# Patient Record
Sex: Male | Born: 1935 | Race: White | Hispanic: No | Marital: Married | Smoking: Never smoker
Health system: Southern US, Community
[De-identification: ages and names within clinical notes are randomized; demographics above are authoritative.]

## PROBLEM LIST (undated history)

## (undated) DIAGNOSIS — E785 Hyperlipidemia, unspecified: Secondary | ICD-10-CM

## (undated) DIAGNOSIS — I739 Peripheral vascular disease, unspecified: Secondary | ICD-10-CM

## (undated) HISTORY — PX: CAROTID ENDARTERECTOMY: SUR193

---

## 1999-09-29 ENCOUNTER — Encounter: Payer: Self-pay | Admitting: Emergency Medicine

## 1999-09-29 ENCOUNTER — Emergency Department (HOSPITAL_COMMUNITY): Admission: EM | Admit: 1999-09-29 | Discharge: 1999-09-29 | Payer: Self-pay | Admitting: Emergency Medicine

## 2003-04-04 ENCOUNTER — Ambulatory Visit (HOSPITAL_COMMUNITY): Admission: RE | Admit: 2003-04-04 | Discharge: 2003-04-04 | Payer: Self-pay | Admitting: Family Medicine

## 2003-04-04 ENCOUNTER — Encounter: Payer: Self-pay | Admitting: Family Medicine

## 2007-03-07 ENCOUNTER — Ambulatory Visit: Admission: RE | Admit: 2007-03-07 | Discharge: 2007-03-07 | Payer: Self-pay | Admitting: Family Medicine

## 2007-03-07 ENCOUNTER — Ambulatory Visit: Payer: Self-pay | Admitting: Vascular Surgery

## 2007-03-07 ENCOUNTER — Encounter: Payer: Self-pay | Admitting: Vascular Surgery

## 2007-04-07 ENCOUNTER — Ambulatory Visit: Payer: Self-pay | Admitting: *Deleted

## 2007-04-28 ENCOUNTER — Ambulatory Visit: Payer: Self-pay | Admitting: *Deleted

## 2007-04-28 ENCOUNTER — Encounter: Admission: RE | Admit: 2007-04-28 | Discharge: 2007-04-28 | Payer: Self-pay | Admitting: *Deleted

## 2007-05-30 ENCOUNTER — Observation Stay (HOSPITAL_COMMUNITY): Admission: AD | Admit: 2007-05-30 | Discharge: 2007-05-31 | Payer: Self-pay | Admitting: *Deleted

## 2007-05-30 ENCOUNTER — Encounter (INDEPENDENT_AMBULATORY_CARE_PROVIDER_SITE_OTHER): Payer: Self-pay | Admitting: *Deleted

## 2007-05-30 ENCOUNTER — Ambulatory Visit: Payer: Self-pay | Admitting: *Deleted

## 2007-06-09 ENCOUNTER — Ambulatory Visit: Payer: Self-pay | Admitting: *Deleted

## 2011-04-21 NOTE — H&P (Signed)
NAME:  Bryan Long, Bryan Long               ACCOUNT NO.:  [id number]   MEDICAL RECORD NO.:  [id number]          PATIENT TYPE:  INP   LOCATION:  NA                           FACILITY:  [facility]   PHYSICIAN:  Balinda Quails, M.D.    DATE OF BIRTH:  1929-02-22   DATE OF ADMISSION:  05/30/2007  DATE OF DISCHARGE:                              HISTORY & PHYSICAL   PRIMARY CARE PHYSICIAN:  Arvella Merles, MD.   REASON FOR ADMISSION:  Severe right internal carotid artery stenosis.   HISTORY:  Mr. Noreen is a 77 year old gentleman with a history of lower  extremity claudication.  He was seen in the office on Apr 07, 2007, with  pain in his legs with brisk walking.   During evaluation he was noted to have a right carotid bruit.  He  underwent carotid Doppler evaluation.  This revealed severe right  internal carotid artery stenosis with peak velocity of 678/231 cm/sec.   CT angiogram was carried out, which verified a severe right internal  carotid artery stenosis.   The patient is asymptomatic.  Denies sensory, motor or visual deficit.   PAST MEDICAL HISTORY:  1. Hyperlipidemia.  2. Mild aortic stenosis.  3. Skin cancer.  4. Depression.   PAST SURGICAL HISTORY:  Unremarkable.   MEDICATIONS:  Aspirin 325 mg daily.   ALLERGIES:  None known.   SOCIAL HISTORY:  The patient is married with seven children.  He is  retired from [employer].  He does not currently smoke.  He never used  tobacco significantly.  He does not consume alcohol.   FAMILY HISTORY:  His mother died at age 36 with complications of a hip  fracture.  Father died at age 45 with head and neck cancer.  One brother  deceased at age 47 with a history of colon cancer.  Two brothers  deceased in their early 64s from coronary disease.  He has two sisters,  who are living at age 30 and 51 and generally well.   REVIEW OF SYSTEMS:  The patient denies weight change or anorexia.  No  fever or chills.  No shortness of breath, cough or  sputum production.  He has regular bowel habits.  Appetite is good.  No abdominal pain.  No  dysuria or frequency.  Denies dizziness, headache or syncope.   PHYSICAL EXAMINATION:  GENERAL:  A well-appearing 77 year old male.  Alert and oriented.  VITAL SIGNS:  BP 192/80, pulse is 70 per minute and regular,  respirations 20 per minute.  HEENT:  Mouth and throat clear.  Poor dentition.  NECK:  No cervical adenopathy or thyromegaly.  CARDIOVASCULAR:  Harsh right carotid bruit.  Normal heart sounds.  A 2/6  pansystolic murmur.  CHEST:  Air entry equal bilaterally without rales or rhonchi.  ABDOMEN:  Soft and nontender.  No mass or organomegaly.  LOWER EXTREMITIES:  2+ femoral pulses.  No popliteal, posterior tibial,  or dorsalis pedis pulses.  No lower extremity ulceration.  Mild  pigmentation of the lower extremities.  No ankle edema.  NEUROLOGIC:  The patient is alert and  oriented.  Cranial nerves intact.  Strength equal bilaterally.  Reflexes 1+ bilaterally.   INVESTIGATIONS:  Lower extremity arterial Doppler evaluation reveals  ankle brachial indices 0.46 bilaterally with diffuse plaque in the  superficial femoral artery.   Carotid Doppler evaluation revealed severe right internal carotid artery  stenosis and moderate left internal carotid artery stenosis.  Antegrade  vertebral flow bilaterally.   CT angiogram verifies a severe right internal carotid artery stenosis.   IMPRESSION:  1. Asymptomatic severe right internal carotid artery stenosis.  2. Moderate bilateral lower extremity claudication with minimal      lifestyle complaints.  3. Hyperlipidemia.  4. Heart murmur.   RECOMMENDATION:  The patient will undergo elective right carotid  endarterectomy for reduction of stroke risk.  Potential risks of the  operative procedure discussed with the patient with a major morbidity or  mortality of 1-2% to include but not limited to MI, CVA, cranial nerve  injury, and  stroke.      Balinda Quails, M.D.  Electronically Signed     PGH/MEDQ  D:  05/30/2007  T:  05/30/2007  Job:  [id number]   cc:   Arvella Merles, MD

## 2011-04-21 NOTE — Assessment & Plan Note (Signed)
OFFICE VISIT   CHALMER, ZHENG  DOB:  Oct 20, 1929                                       06/09/2007  ZOXWR#:[id number]   The patient returns today for elective followup following right carotid  endarterectomy carried out 05/30/2007 at [facility].  This was  an uneventful operative procedure.  Discharged home in good condition.   His blood pressure is 163/64, pulse is 66 per minute.  Right neck  incision healing unremarkably.  He is neurologically intact.  No carotid  bruits.   The patient has done quite well.  I note from his Doppler evaluation he  does have a moderate left internal carotid artery stenosis.  This will  be followed in 6 months with a carotid Doppler.  He is also noted to  have a heart murmur and a 2D echocardiogram will be arranged.   Balinda Quails, M.D.  Electronically Signed   PGH/MEDQ  D:  06/09/2007  T:  06/13/2007  Job:  [id number]   cc:   Holley Bouche, M.D.

## 2011-04-21 NOTE — Discharge Summary (Signed)
Bryan Long, EVERY               ACCOUNT NO.:  [id number]   MEDICAL RECORD NO.:  [id number]          PATIENT TYPE:  INP   LOCATION:  3305                         FACILITY:  [facility]   PHYSICIAN:  Balinda Quails, M.D.    DATE OF BIRTH:  1929/04/15   DATE OF ADMISSION:  05/30/2007  DATE OF DISCHARGE:  05/31/2007                               DISCHARGE SUMMARY   ADMISSION DIAGNOSIS:  Severe right internal carotid artery stenosis,  asymptomatic.   DISCHARGE AND SECONDARY DIAGNOSES:  1. Severe right internal carotid artery stenosis, asymptomatic, status      post right carotid endarterectomy.  2. Hyperlipidemia.  3. Aortic stenosis.  4. History of skin cancer.  5. History of depression.  6. Peripheral vascular disease with moderate bilateral claudication.   ALLERGIES:  No known drug allergies.   PROCEDURES:  May 30, 2007, right carotid endarterectomy with Dacron  patch angioplasty by Dr. Denman Long.   BRIEF HISTORY:  Bryan Long is a 77 year old Caucasian male with history  of lower extremity claudication.  He was actually first referred to Dr.  Madilyn Fireman on Apr 07, 2007, for evaluation of his claudication which occurred  with brisk walking.  During the evaluation, he was noted to have a right  carotid bruit.  He underwent right carotid Doppler evaluation which  revealed severe right internal carotid artery stenosis with peak  velocity of 678/231 cm/sec.  This was consistent with greater than 80%  stenosis.  The left internal carotid artery revealed 40-59% stenosis.  There was antegrade right vertebral flow and antegrade resistant left  vertebral flow.  He later underwent a CT angiogram which verified a  severe right internal carotid artery stenosis.  The patient was  asymptomatic.  Dr. Madilyn Fireman recommended proceeding with right carotid  endarterectomy to reduce his future risk for stroke.  In regards to his  peripheral vascular disease, ABIs were a 0.46 bilaterally, and Dr. Madilyn Fireman  recommended no surgical intervention at that time.   HOSPITAL COURSE:  Bryan Long was electively admitted to [facility] on May 30, 2007, and underwent right carotid endarterectomy.  He was extubated neurologically intact on the short-stay recovery unit  and was transferred to step-down unit 3300 where he remained until  discharge.  His postoperative course was uneventful.  He remained  hemodynamically stable in sinus rhythm, saturating 94% on room air.  He  did have some mild acute blood loss anemia with hemoglobin 14.5 and  hematocrit 44 preoperatively and down to 11.6 and 34.5, respectively,  postoperatively.  White count was 10.8, platelet count 231. Sodium 139,  potassium 4.1, BUN 15, creatinine 1.19, blood glucose 110.  He was able  to tolerate regular diet without nausea, vomiting, or dysphagia.  He was  ambulating and voiding without difficulty.  Pain was controlled on oral  medication.  Physical exam shows heart rhythm regular rate and rhythm.  Lung sounds were clear.  Abdominal exam was benign.  Neurologically he  was intact with his tongue midline.  His incision showed no evidence of  hematoma.  Subsequently felt appropriate for  discharge home in the late  morning of postoperative day #1, May 31, 2007.   DISCHARGE INSTRUCTIONS:  He is to follow a low-fat, low-salt diet.  Avoid driving or heavy lifting for the next 2 weeks.  He has been  encouraged to continue daily walking exercise, increase activity as  tolerated.  He may shower and clean incisions gently with soap and water  and should call if he develops fever greater than 101 or redness or  drainage from his incision site.   DISCHARGE MEDICATIONS:  1. Coated aspirin 325 mg p.o. daily.  2. Percocet 5/325 mg 1-2 tablets p.o. q.4 h p.r.n. pain.   FOLLOW UP:  He is to see Dr. Madilyn Fireman at the vascular vein office in 2  weeks.  Our office will contact him regarding specific appointment date  and time.       Bryan Long, P.A.      Balinda Quails, M.D.  Electronically Signed    AWZ/MEDQ  D:  05/31/2007  T:  05/31/2007  Job:  [id number]   cc:   Holley Bouche, M.D.

## 2011-04-21 NOTE — Op Note (Signed)
NAMECORDE, ANTONINI               ACCOUNT NO.:  [id number]   MEDICAL RECORD NO.:  [id number]          PATIENT TYPE:  OBV   LOCATION:  2861                         FACILITY:  [facility]   PHYSICIAN:  Balinda Quails, M.D.    DATE OF BIRTH:  [birth year]   DATE OF PROCEDURE:  05/30/2007  DATE OF DISCHARGE:  05/31/2007                               OPERATIVE REPORT   SURGEON:  Balinda Quails, M.D.   ASSISTANT:  Jerold Coombe, P.A.   ANESTHESIA:  General endotracheal.   ANESTHESIOLOGIST:  Sheldon Silvan, M.D.   PREOPERATIVE DIAGNOSIS:  Severe right internal carotid artery stenosis.   POSTOPERATIVE DIAGNOSIS:  Severe right internal carotid artery stenosis.   PROCEDURE:  Right carotid endarterectomy, Dacron patch angioplasty.   CLINICAL NOTE:  Mr. Bryan Long is a 77 year old gentleman with known  peripheral vascular disease.  Recent evaluation revealed a right carotid  bruit.  Doppler evaluation revealed severe right internal carotid artery  stenosis.  This was verified by CT angiogram.  He is scheduled to  undergo right carotid endarterectomy today for reduction of stroke risk.  The risks and benefits the operative procedure were explained to the  patient in detail, these include but are not limited to MI, CVA, cranial  nerve injury, and death.   OPERATIVE PROCEDURE:  The patient was brought to the operating room in  stable hemodynamic condition.  He was placed under general endotracheal  anesthesia.  A Foley catheter and arterial line were in place.  The  right neck prepped and draped in sterile fashion.   Curvilinear skin incision made along the anterior border of the right  sternomastoid muscle.  Dissection carried down through subcutaneous  tissue with electrocautery.  The platysma divided.  Deep dissection  carried down to expose the facial vein which was ligated with 2-0 silk  and divided.  The common carotid artery was freed and encircled with a  vessel loop.  I mobilized  down to the omohyoid muscle.  The vagus nerve  reflected posteriorly and preserved.  The origin of the superior thyroid  and external carotid were dissected out and encircled with vessel loops.  The internal carotid artery followed distally up to the posterior belly  of the digastric muscle and encircled with a vessel loop.   Evaluation of the carotid bifurcation verified carotid plaque in the  bifurcation and bulb extending into the internal carotid artery origin.  The distal internal carotid artery was free of plaque.   The patient was administered 7000 units of heparin intravenously.  Adequate circulation time permitted.  The carotid vessels controlled  with clamps.  A longitudinal arteriotomy made in the distal common  carotid artery.  The arteriotomy extended across the carotid bulb and up  into the internal carotid artery.  There was a severe left internal  carotid artery stenosis.  There was moderate ulcerated plaque.  A shunt  was inserted.   The plaque was then removed with an endarterectomy elevator.  The plaque  raised down into the common carotid artery where it was divided  transversely with Potts scissors.  Plaque raised up in the bulb, and the  superior thyroid and external carotid were endarterectomized using an  eversion technique.  The distal internal carotid artery plaque feathered  out well.  Fragments of plaque were removed with fine forceps.  The site  was irrigated with heparin and saline solution.   A patch angioplasty of the endarterectomy site was carried out with a  Finesse Dacron patch using running 6-0 Prolene suture.  At completion of  the patch angioplasty, the shunt was removed.  All vessels well-flushed.  Clamps were removed, directing the initial antegrade flow up the  external carotid artery, following that, the internal carotid was  released.   Adequate hemostasis was obtained.  There was excellent pulse and Doppler  signal.  The patient was  administered 50 mg of protamine intravenously.   The sternomastoid fascia closed with running 2-0 Vicryl suture.  Platysma closed with running 3-0 Vicryl suture.  Skin closed with 4-0  Monocryl.  Steri-Strips applied.   The patient tolerated the procedure well.  No apparent complications.  Transferred to recovery room in stable condition.      Balinda Quails, M.D.  Electronically Signed     PGH/MEDQ  D:  09/24/2007  T:  09/26/2007  Job:  [id number]   cc:   Holley Bouche, M.D.

## 2011-09-23 LAB — TYPE AND SCREEN: Antibody Screen: NEGATIVE

## 2011-09-23 LAB — BASIC METABOLIC PANEL
CO2: 24
Chloride: 109
GFR calc Af Amer: >60
Glucose, Bld: 110 — ABNORMAL HIGH
Sodium: 139

## 2011-09-23 LAB — CBC
HCT: 44
Hemoglobin: 11.6 — ABNORMAL LOW
Hemoglobin: 14.5
MCHC: 33
MCHC: 33.6
MCV: 91.3
MCV: 92.7
Platelets: 309
RBC: 3.78 — ABNORMAL LOW
RBC: 4.75
RDW: 15.2 — ABNORMAL HIGH
RDW: 15.2 — ABNORMAL HIGH
WBC: 7.8

## 2011-09-23 LAB — COMPREHENSIVE METABOLIC PANEL
Albumin: 4
Alkaline Phosphatase: 65
BUN: 17
Calcium: 9.3
Creatinine, Ser: 1.12
Glucose, Bld: 85
Potassium: 4.5
Total Protein: 7.2

## 2011-09-23 LAB — URINALYSIS, ROUTINE W REFLEX MICROSCOPIC
Bilirubin Urine: NEGATIVE
Glucose, UA: NEGATIVE
Hgb urine dipstick: NEGATIVE
Protein, ur: NEGATIVE

## 2011-09-23 LAB — PROTIME-INR
INR: 1
Prothrombin Time: 12.8

## 2011-09-23 LAB — APTT: aPTT: 27

## 2014-03-01 ENCOUNTER — Ambulatory Visit
Admission: RE | Admit: 2014-03-01 | Discharge: 2014-03-01 | Disposition: A | Discharge status: Home / Self Care | Payer: Medicare Other | Source: Ambulatory Visit | Attending: Family Medicine | Admitting: Family Medicine

## 2014-03-01 ENCOUNTER — Other Ambulatory Visit: Payer: Self-pay | Admitting: Family Medicine

## 2014-03-01 DIAGNOSIS — J40 Bronchitis, not specified as acute or chronic: Secondary | ICD-10-CM

## 2014-03-01 DIAGNOSIS — R059 Cough, unspecified: Secondary | ICD-10-CM

## 2014-03-01 DIAGNOSIS — R05 Cough: Secondary | ICD-10-CM

## 2014-06-18 ENCOUNTER — Other Ambulatory Visit: Payer: Self-pay | Admitting: Family Medicine

## 2014-06-18 DIAGNOSIS — R042 Hemoptysis: Secondary | ICD-10-CM

## 2014-06-18 DIAGNOSIS — R0602 Shortness of breath: Secondary | ICD-10-CM

## 2014-06-20 ENCOUNTER — Other Ambulatory Visit: Payer: Medicare Other

## 2014-06-20 ENCOUNTER — Ambulatory Visit: Payer: Medicare Other | Admitting: Cardiology

## 2014-06-21 ENCOUNTER — Other Ambulatory Visit (HOSPITAL_COMMUNITY): Payer: Medicare Other

## 2014-06-25 ENCOUNTER — Ambulatory Visit (INDEPENDENT_AMBULATORY_CARE_PROVIDER_SITE_OTHER)
Admission: RE | Admit: 2014-06-25 | Discharge: 2014-06-25 | Disposition: A | Discharge status: Home / Self Care | Payer: Medicare Other | Source: Ambulatory Visit | Attending: Vascular Surgery | Admitting: Vascular Surgery

## 2014-06-25 ENCOUNTER — Ambulatory Visit (HOSPITAL_COMMUNITY)
Admission: RE | Admit: 2014-06-25 | Discharge: 2014-06-25 | Disposition: A | Discharge status: Home / Self Care | Payer: Medicare Other | Source: Ambulatory Visit | Attending: Vascular Surgery | Admitting: Vascular Surgery

## 2014-06-25 ENCOUNTER — Other Ambulatory Visit (HOSPITAL_COMMUNITY): Payer: Self-pay | Admitting: Cardiology

## 2014-06-25 DIAGNOSIS — I739 Peripheral vascular disease, unspecified: Secondary | ICD-10-CM

## 2014-06-25 DIAGNOSIS — I6529 Occlusion and stenosis of unspecified carotid artery: Secondary | ICD-10-CM

## 2014-06-27 ENCOUNTER — Ambulatory Visit: Payer: Medicare Other | Admitting: Cardiology

## 2014-06-28 ENCOUNTER — Encounter (HOSPITAL_COMMUNITY): Payer: Self-pay | Admitting: Emergency Medicine

## 2014-06-28 ENCOUNTER — Inpatient Hospital Stay (HOSPITAL_COMMUNITY): Payer: Medicare Other

## 2014-06-28 ENCOUNTER — Ambulatory Visit (HOSPITAL_COMMUNITY): Admit: 2014-06-28 | Payer: Self-pay | Admitting: Cardiovascular Disease

## 2014-06-28 ENCOUNTER — Emergency Department (HOSPITAL_COMMUNITY): Payer: Medicare Other

## 2014-06-28 ENCOUNTER — Encounter (HOSPITAL_COMMUNITY): Admission: EM | Disposition: E | Payer: Self-pay | Source: Home / Self Care | Attending: Pulmonary Disease

## 2014-06-28 DIAGNOSIS — Z66 Do not resuscitate: Secondary | ICD-10-CM | POA: Diagnosis not present

## 2014-06-28 DIAGNOSIS — I6529 Occlusion and stenosis of unspecified carotid artery: Secondary | ICD-10-CM | POA: Diagnosis present

## 2014-06-28 DIAGNOSIS — IMO0002 Reserved for concepts with insufficient information to code with codable children: Secondary | ICD-10-CM | POA: Diagnosis not present

## 2014-06-28 DIAGNOSIS — J9600 Acute respiratory failure, unspecified whether with hypoxia or hypercapnia: Secondary | ICD-10-CM | POA: Diagnosis present

## 2014-06-28 DIAGNOSIS — E876 Hypokalemia: Secondary | ICD-10-CM | POA: Diagnosis not present

## 2014-06-28 DIAGNOSIS — I350 Nonrheumatic aortic (valve) stenosis: Secondary | ICD-10-CM | POA: Diagnosis present

## 2014-06-28 DIAGNOSIS — G931 Anoxic brain damage, not elsewhere classified: Secondary | ICD-10-CM | POA: Diagnosis present

## 2014-06-28 DIAGNOSIS — Z515 Encounter for palliative care: Secondary | ICD-10-CM | POA: Diagnosis not present

## 2014-06-28 DIAGNOSIS — I2510 Atherosclerotic heart disease of native coronary artery without angina pectoris: Secondary | ICD-10-CM | POA: Diagnosis present

## 2014-06-28 DIAGNOSIS — E872 Acidosis, unspecified: Secondary | ICD-10-CM | POA: Diagnosis present

## 2014-06-28 DIAGNOSIS — E785 Hyperlipidemia, unspecified: Secondary | ICD-10-CM | POA: Diagnosis present

## 2014-06-28 DIAGNOSIS — I739 Peripheral vascular disease, unspecified: Secondary | ICD-10-CM | POA: Diagnosis present

## 2014-06-28 DIAGNOSIS — R7309 Other abnormal glucose: Secondary | ICD-10-CM | POA: Diagnosis present

## 2014-06-28 DIAGNOSIS — N179 Acute kidney failure, unspecified: Secondary | ICD-10-CM

## 2014-06-28 DIAGNOSIS — I10 Essential (primary) hypertension: Secondary | ICD-10-CM | POA: Diagnosis present

## 2014-06-28 DIAGNOSIS — J189 Pneumonia, unspecified organism: Secondary | ICD-10-CM | POA: Diagnosis present

## 2014-06-28 DIAGNOSIS — R4020 Unspecified coma: Secondary | ICD-10-CM | POA: Diagnosis present

## 2014-06-28 DIAGNOSIS — R570 Cardiogenic shock: Secondary | ICD-10-CM | POA: Diagnosis present

## 2014-06-28 DIAGNOSIS — R1319 Other dysphagia: Secondary | ICD-10-CM | POA: Diagnosis present

## 2014-06-28 DIAGNOSIS — G40401 Other generalized epilepsy and epileptic syndromes, not intractable, with status epilepticus: Secondary | ICD-10-CM | POA: Diagnosis not present

## 2014-06-28 DIAGNOSIS — D649 Anemia, unspecified: Secondary | ICD-10-CM | POA: Diagnosis present

## 2014-06-28 DIAGNOSIS — I214 Non-ST elevation (NSTEMI) myocardial infarction: Secondary | ICD-10-CM | POA: Diagnosis present

## 2014-06-28 DIAGNOSIS — I498 Other specified cardiac arrhythmias: Secondary | ICD-10-CM | POA: Diagnosis not present

## 2014-06-28 DIAGNOSIS — F411 Generalized anxiety disorder: Secondary | ICD-10-CM | POA: Diagnosis not present

## 2014-06-28 DIAGNOSIS — R7989 Other specified abnormal findings of blood chemistry: Secondary | ICD-10-CM | POA: Diagnosis present

## 2014-06-28 DIAGNOSIS — K7200 Acute and subacute hepatic failure without coma: Secondary | ICD-10-CM | POA: Diagnosis present

## 2014-06-28 DIAGNOSIS — I428 Other cardiomyopathies: Secondary | ICD-10-CM | POA: Diagnosis present

## 2014-06-28 DIAGNOSIS — I4510 Unspecified right bundle-branch block: Secondary | ICD-10-CM | POA: Diagnosis present

## 2014-06-28 DIAGNOSIS — G40901 Epilepsy, unspecified, not intractable, with status epilepticus: Secondary | ICD-10-CM | POA: Diagnosis present

## 2014-06-28 DIAGNOSIS — I509 Heart failure, unspecified: Secondary | ICD-10-CM | POA: Diagnosis present

## 2014-06-28 DIAGNOSIS — I469 Cardiac arrest, cause unspecified: Secondary | ICD-10-CM | POA: Diagnosis present

## 2014-06-28 DIAGNOSIS — I4901 Ventricular fibrillation: Secondary | ICD-10-CM | POA: Diagnosis present

## 2014-06-28 DIAGNOSIS — J9601 Acute respiratory failure with hypoxia: Secondary | ICD-10-CM

## 2014-06-28 DIAGNOSIS — I5043 Acute on chronic combined systolic (congestive) and diastolic (congestive) heart failure: Secondary | ICD-10-CM | POA: Diagnosis present

## 2014-06-28 DIAGNOSIS — I080 Rheumatic disorders of both mitral and aortic valves: Secondary | ICD-10-CM | POA: Diagnosis present

## 2014-06-28 HISTORY — PX: LEFT HEART CATHETERIZATION WITH CORONARY ANGIOGRAM: SHX5451

## 2014-06-28 HISTORY — DX: Hyperlipidemia, unspecified: E78.5

## 2014-06-28 HISTORY — DX: Peripheral vascular disease, unspecified: I73.9

## 2014-06-28 LAB — CBC WITH DIFFERENTIAL/PLATELET
Basophils Absolute: 0.1 10*3/uL (ref 0.0–0.1)
Basophils Relative: 0 % (ref 0–1)
Eosinophils Absolute: 0.6 10*3/uL (ref 0.0–0.7)
Eosinophils Relative: 3 % (ref 0–5)
HEMATOCRIT: 32.3 % — AB (ref 39.0–52.0)
HEMOGLOBIN: 10.5 g/dL — AB (ref 13.0–17.0)
LYMPHS ABS: 5.6 10*3/uL — AB (ref 0.7–4.0)
LYMPHS PCT: 32 % (ref 12–46)
MCH: 30.1 pg (ref 26.0–34.0)
MCHC: 32.5 g/dL (ref 30.0–36.0)
MCV: 92.6 fL (ref 78.0–100.0)
MONO ABS: 1.1 10*3/uL — AB (ref 0.1–1.0)
MONOS PCT: 6 % (ref 3–12)
NEUTROS ABS: 10 10*3/uL — AB (ref 1.7–7.7)
NEUTROS PCT: 59 % (ref 43–77)
Platelets: 202 10*3/uL (ref 150–400)
RBC: 3.49 MIL/uL — AB (ref 4.22–5.81)
RDW: 16.4 % — ABNORMAL HIGH (ref 11.5–15.5)
WBC: 17.3 10*3/uL — AB (ref 4.0–10.5)

## 2014-06-28 LAB — COMPREHENSIVE METABOLIC PANEL
ALK PHOS: 83 U/L (ref 39–117)
ALT: 150 U/L — ABNORMAL HIGH (ref 0–53)
ANION GAP: 24 — AB (ref 5–15)
AST: 175 U/L — ABNORMAL HIGH (ref 0–37)
Albumin: 2.8 g/dL — ABNORMAL LOW (ref 3.5–5.2)
BILIRUBIN TOTAL: 0.8 mg/dL (ref 0.3–1.2)
BUN: 19 mg/dL (ref 6–23)
CHLORIDE: 100 meq/L (ref 96–112)
CO2: 14 meq/L — AB (ref 19–32)
CREATININE: 1.16 mg/dL (ref 0.50–1.35)
Calcium: 7.9 mg/dL — ABNORMAL LOW (ref 8.4–10.5)
GFR calc Af Amer: 65 mL/min — ABNORMAL LOW (ref >90)
GFR, EST NON AFRICAN AMERICAN: 56 mL/min — AB (ref >90)
Glucose, Bld: 295 mg/dL — ABNORMAL HIGH (ref 70–99)
POTASSIUM: 4.5 meq/L (ref 3.7–5.3)
Sodium: 138 mEq/L (ref 137–147)
Total Protein: 6 g/dL (ref 6.0–8.3)

## 2014-06-28 LAB — PROTIME-INR
INR: 1.33 (ref 0.00–1.49)
INR: 1.33 (ref 0.00–1.49)
Prothrombin Time: 16.5 seconds — ABNORMAL HIGH (ref 11.6–15.2)
Prothrombin Time: 16.5 seconds — ABNORMAL HIGH (ref 11.6–15.2)

## 2014-06-28 LAB — BASIC METABOLIC PANEL
Anion gap: 15 (ref 5–15)
BUN: 22 mg/dL (ref 6–23)
CALCIUM: 7.6 mg/dL — AB (ref 8.4–10.5)
CHLORIDE: 104 meq/L (ref 96–112)
CO2: 18 mEq/L — ABNORMAL LOW (ref 19–32)
CREATININE: 1.11 mg/dL (ref 0.50–1.35)
GFR calc non Af Amer: 59 mL/min — ABNORMAL LOW (ref >90)
GFR, EST AFRICAN AMERICAN: 69 mL/min — AB (ref >90)
Glucose, Bld: 260 mg/dL — ABNORMAL HIGH (ref 70–99)
Potassium: 3.8 mEq/L (ref 3.7–5.3)
Sodium: 137 mEq/L (ref 137–147)

## 2014-06-28 LAB — URINALYSIS, ROUTINE W REFLEX MICROSCOPIC
BILIRUBIN URINE: NEGATIVE
GLUCOSE, UA: NEGATIVE mg/dL
KETONES UR: NEGATIVE mg/dL
NITRITE: NEGATIVE
PH: 6 (ref 5.0–8.0)
PROTEIN: 100 mg/dL — AB
Specific Gravity, Urine: 1.013 (ref 1.005–1.030)
Urobilinogen, UA: 1 mg/dL (ref 0.0–1.0)

## 2014-06-28 LAB — GLUCOSE, CAPILLARY
GLUCOSE-CAPILLARY: 227 mg/dL — AB (ref 70–99)
GLUCOSE-CAPILLARY: 207 mg/dL — AB (ref 70–99)
Glucose-Capillary: 211 mg/dL — ABNORMAL HIGH (ref 70–99)
Glucose-Capillary: 267 mg/dL — ABNORMAL HIGH (ref 70–99)
Glucose-Capillary: 238 mg/dL — ABNORMAL HIGH (ref 70–99)
Glucose-Capillary: 223 mg/dL — ABNORMAL HIGH (ref 70–99)
Glucose-Capillary: 217 mg/dL — ABNORMAL HIGH (ref 70–99)

## 2014-06-28 LAB — URINE MICROSCOPIC-ADD ON

## 2014-06-28 LAB — POCT I-STAT, CHEM 8
BUN: 20 mg/dL (ref 6–23)
BUN: 20 mg/dL (ref 6–23)
BUN: 20 mg/dL (ref 6–23)
CALCIUM ION: 1.06 mmol/L — AB (ref 1.13–1.30)
CALCIUM ION: 1.09 mmol/L — AB (ref 1.13–1.30)
CHLORIDE: 107 meq/L (ref 96–112)
CREATININE: 1.2 mg/dL (ref 0.50–1.35)
Calcium, Ion: 1.09 mmol/L — ABNORMAL LOW (ref 1.13–1.30)
Chloride: 109 mEq/L (ref 96–112)
Chloride: 106 mEq/L (ref 96–112)
Creatinine, Ser: 1.1 mg/dL (ref 0.50–1.35)
Creatinine, Ser: 1.1 mg/dL (ref 0.50–1.35)
GLUCOSE: 289 mg/dL — AB (ref 70–99)
Glucose, Bld: 194 mg/dL — ABNORMAL HIGH (ref 70–99)
Glucose, Bld: 247 mg/dL — ABNORMAL HIGH (ref 70–99)
HCT: 36 % — ABNORMAL LOW (ref 39.0–52.0)
HEMATOCRIT: 41 % (ref 39.0–52.0)
HEMATOCRIT: 42 % (ref 39.0–52.0)
HEMOGLOBIN: 13.9 g/dL (ref 13.0–17.0)
Hemoglobin: 12.2 g/dL — ABNORMAL LOW (ref 13.0–17.0)
Hemoglobin: 14.3 g/dL (ref 13.0–17.0)
Potassium: 4.1 mEq/L (ref 3.7–5.3)
Potassium: 3.6 mEq/L — ABNORMAL LOW (ref 3.7–5.3)
Potassium: 3.5 mEq/L — ABNORMAL LOW (ref 3.7–5.3)
SODIUM: 139 meq/L (ref 137–147)
Sodium: 141 mEq/L (ref 137–147)
Sodium: 139 mEq/L (ref 137–147)
TCO2: 20 mmol/L (ref 0–100)
TCO2: 17 mmol/L (ref 0–100)
TCO2: 17 mmol/L (ref 0–100)

## 2014-06-28 LAB — APTT
APTT: 32 s (ref 24–37)
aPTT: 31 seconds (ref 24–37)

## 2014-06-28 LAB — LACTIC ACID, PLASMA: Lactic Acid, Venous: 5.6 mmol/L — ABNORMAL HIGH (ref 0.5–2.2)

## 2014-06-28 LAB — POCT I-STAT 3, ART BLOOD GAS (G3+)
ACID-BASE DEFICIT: 4 mmol/L — AB (ref 0.0–2.0)
BICARBONATE: 20.9 meq/L (ref 20.0–24.0)
O2 SAT: 100 %
PO2 ART: 519 mmHg — AB (ref 80.0–100.0)
TCO2: 22 mmol/L (ref 0–100)
pCO2 arterial: 38.6 mmHg (ref 35.0–45.0)
pH, Arterial: 7.343 — ABNORMAL LOW (ref 7.350–7.450)

## 2014-06-28 LAB — PRO B NATRIURETIC PEPTIDE: PRO B NATRI PEPTIDE: 4283 pg/mL — AB (ref 0–450)

## 2014-06-28 LAB — TROPONIN I: Troponin I: 0.38 ng/mL (ref <0.30)

## 2014-06-28 LAB — I-STAT TROPONIN, ED: TROPONIN I, POC: 0.16 ng/mL — AB (ref 0.00–0.08)

## 2014-06-28 LAB — POCT ACTIVATED CLOTTING TIME: Activated Clotting Time: 107 seconds

## 2014-06-28 LAB — MRSA PCR SCREENING: MRSA by PCR: NEGATIVE

## 2014-06-28 SURGERY — LEFT HEART CATHETERIZATION WITH CORONARY ANGIOGRAM
Anesthesia: LOCAL

## 2014-06-28 MED ORDER — SODIUM CHLORIDE 0.9 % IV BOLUS (SEPSIS)
500.0000 mL | INTRAVENOUS | Status: AC
Start: 2014-06-28 — End: 2014-06-28
  Administered 2014-06-28: 500 mL via INTRAVENOUS

## 2014-06-28 MED ORDER — METOPROLOL TARTRATE 1 MG/ML IV SOLN
INTRAVENOUS | Status: AC
  Administered 2014-06-28: 5 mg
  Filled 2014-06-28: qty 20

## 2014-06-28 MED ORDER — ONDANSETRON HCL 4 MG/2ML IJ SOLN: 4.0000 mg | Freq: Four times a day (QID) | INTRAMUSCULAR | Status: DC | PRN

## 2014-06-28 MED ORDER — FENTANYL CITRATE 0.05 MG/ML IJ SOLN
100.0000 ug | Freq: Once | INTRAMUSCULAR | Status: AC | PRN
  Administered 2014-06-28: 100 ug via INTRAVENOUS

## 2014-06-28 MED ORDER — ARTIFICIAL TEARS OP OINT: 1.0000 "application " | TOPICAL_OINTMENT | Freq: Three times a day (TID) | OPHTHALMIC | Status: DC

## 2014-06-28 MED ORDER — INSULIN ASPART 100 UNIT/ML ~~LOC~~ SOLN
0.0000 [IU] | SUBCUTANEOUS | Status: DC
  Administered 2014-06-28: 3 [IU] via SUBCUTANEOUS
  Administered 2014-06-28: 5 [IU] via SUBCUTANEOUS
  Administered 2014-06-29: 1 [IU] via SUBCUTANEOUS
  Administered 2014-06-29: 2 [IU] via SUBCUTANEOUS

## 2014-06-28 MED ORDER — SODIUM CHLORIDE 0.9 % IV SOLN
25.0000 ug/h | INTRAVENOUS | Status: DC
  Administered 2014-06-28 (×2): 50 ug/h via INTRAVENOUS
  Administered 2014-06-29 – 2014-06-30 (×2): 200 ug/h via INTRAVENOUS
  Filled 2014-06-28 (×4): qty 50

## 2014-06-28 MED ORDER — ACETAMINOPHEN 325 MG PO TABS
650.0000 mg | ORAL_TABLET | ORAL | Status: DC | PRN
  Administered 2014-06-29: 650 mg via ORAL
  Filled 2014-06-28: qty 2

## 2014-06-28 MED ORDER — HEPARIN (PORCINE) IN NACL 100-0.45 UNIT/ML-% IJ SOLN
1400.0000 [IU]/h | INTRAMUSCULAR | Status: DC
  Administered 2014-06-28: 650 [IU]/h via INTRAVENOUS
  Administered 2014-06-30: 750 [IU]/h via INTRAVENOUS
  Administered 2014-07-01: 1100 [IU]/h via INTRAVENOUS
  Filled 2014-06-28 (×4): qty 250

## 2014-06-28 MED ORDER — AMIODARONE HCL IN DEXTROSE 360-4.14 MG/200ML-% IV SOLN
60.0000 mg/h | Freq: Once | INTRAVENOUS | Status: AC
  Administered 2014-06-28: 60 mg/h via INTRAVENOUS
  Filled 2014-06-28: qty 200

## 2014-06-28 MED ORDER — LABETALOL HCL 5 MG/ML IV SOLN: 20.0000 mg | Freq: Once | INTRAVENOUS | Status: DC

## 2014-06-28 MED ORDER — CISATRACURIUM BOLUS VIA INFUSION: 0.0500 mg/kg | INTRAVENOUS | Status: DC | PRN

## 2014-06-28 MED ORDER — HEPARIN (PORCINE) IN NACL 2-0.9 UNIT/ML-% IJ SOLN
INTRAMUSCULAR | Status: AC
  Filled 2014-06-28: qty 1500

## 2014-06-28 MED ORDER — ARTIFICIAL TEARS OP OINT
1.0000 "application " | TOPICAL_OINTMENT | Freq: Three times a day (TID) | OPHTHALMIC | Status: DC
  Administered 2014-06-28 – 2014-06-29 (×4): 1 via OPHTHALMIC
  Filled 2014-06-28 (×2): qty 3.5

## 2014-06-28 MED ORDER — SODIUM CHLORIDE 0.9 % IV SOLN
INTRAVENOUS | Status: DC
  Administered 2014-06-29 (×2): via INTRAVENOUS

## 2014-06-28 MED ORDER — MIDAZOLAM BOLUS VIA INFUSION
2.0000 mg | INTRAVENOUS | Status: DC | PRN
Start: 2014-06-28 — End: 2014-06-28

## 2014-06-28 MED ORDER — CISATRACURIUM BOLUS VIA INFUSION: 0.1000 mg/kg | Freq: Once | INTRAVENOUS | Status: DC

## 2014-06-28 MED ORDER — ASPIRIN 81 MG PO CHEW
81.0000 mg | CHEWABLE_TABLET | Freq: Every day | ORAL | Status: DC
  Administered 2014-06-28 – 2014-06-30 (×3): 81 mg via ORAL
  Filled 2014-06-28 (×3): qty 1

## 2014-06-28 MED ORDER — MIDAZOLAM HCL 5 MG/ML IJ SOLN
1.0000 mg/h | INTRAMUSCULAR | Status: DC
Start: 2014-06-28 — End: 2014-06-30
  Administered 2014-06-28: 2 mg/h via INTRAVENOUS
  Administered 2014-06-28 (×2): 1 mg/h via INTRAVENOUS
  Administered 2014-06-29: 2 mg/h via INTRAVENOUS
  Filled 2014-06-28 (×3): qty 10

## 2014-06-28 MED ORDER — NOREPINEPHRINE BITARTRATE 1 MG/ML IV SOLN
2.0000 ug/min | INTRAVENOUS | Status: DC
  Administered 2014-06-28: 10 ug/min via INTRAVENOUS
  Administered 2014-06-29: 6 ug/min via INTRAVENOUS
  Administered 2014-06-29: 15 ug/min via INTRAVENOUS
  Administered 2014-06-29: 12 ug/min via INTRAVENOUS
  Administered 2014-06-29: 20 ug/min via INTRAVENOUS
  Filled 2014-06-28 (×5): qty 4

## 2014-06-28 MED ORDER — HEPARIN SODIUM (PORCINE) 5000 UNIT/ML IJ SOLN: 5000.0000 [IU] | Freq: Three times a day (TID) | INTRAMUSCULAR | Status: DC

## 2014-06-28 MED ORDER — BIOTENE DRY MOUTH MT LIQD
15.0000 mL | Freq: Four times a day (QID) | OROMUCOSAL | Status: DC
  Administered 2014-06-29 – 2014-07-04 (×22): 15 mL via OROMUCOSAL

## 2014-06-28 MED ORDER — SODIUM CHLORIDE 0.9 % IV SOLN: 2000.0000 mL | Freq: Once | INTRAVENOUS | Status: DC

## 2014-06-28 MED ORDER — NITROGLYCERIN IN D5W 200-5 MCG/ML-% IV SOLN
INTRAVENOUS | Status: AC
Start: 2014-06-28 — End: 2014-06-28
  Administered 2014-06-28: 15 ug/min via INTRAVENOUS
  Filled 2014-06-28: qty 250

## 2014-06-28 MED ORDER — MIDAZOLAM HCL 2 MG/2ML IJ SOLN
INTRAMUSCULAR | Status: AC
  Filled 2014-06-28: qty 2

## 2014-06-28 MED ORDER — SODIUM CHLORIDE 0.9 % IJ SOLN
3.0000 mL | Freq: Two times a day (BID) | INTRAMUSCULAR | Status: DC
  Administered 2014-06-29 – 2014-07-04 (×6): 3 mL via INTRAVENOUS

## 2014-06-28 MED ORDER — POTASSIUM CHLORIDE 20 MEQ/15ML (10%) PO LIQD
40.0000 meq | Freq: Once | ORAL | Status: AC
  Administered 2014-06-28: 40 meq
  Filled 2014-06-28: qty 30

## 2014-06-28 MED ORDER — SODIUM CHLORIDE 0.9 % IV SOLN
INTRAVENOUS | Status: DC
  Administered 2014-06-30: 500 mL via INTRAVENOUS

## 2014-06-28 MED ORDER — PANTOPRAZOLE SODIUM 40 MG IV SOLR
40.0000 mg | INTRAVENOUS | Status: DC
Start: 2014-06-28 — End: 2014-06-30
  Administered 2014-06-29 – 2014-06-30 (×2): 40 mg via INTRAVENOUS
  Filled 2014-06-28 (×2): qty 40

## 2014-06-28 MED ORDER — SODIUM CHLORIDE 0.9 % IV SOLN: INTRAVENOUS | Status: DC

## 2014-06-28 MED ORDER — ASPIRIN 300 MG RE SUPP: 300.0000 mg | RECTAL | Status: DC

## 2014-06-28 MED ORDER — SODIUM CHLORIDE 0.9 % IV SOLN: 1.0000 mg/h | INTRAVENOUS | Status: DC

## 2014-06-28 MED ORDER — SODIUM CHLORIDE 0.9 % IV SOLN
2000.0000 mL | Freq: Once | INTRAVENOUS | Status: AC
  Administered 2014-06-28: 2000 mL via INTRAVENOUS

## 2014-06-28 MED ORDER — MIDAZOLAM HCL 5 MG/ML IJ SOLN: 2.0000 mg | Freq: Once | INTRAMUSCULAR | Status: DC

## 2014-06-28 MED ORDER — AMIODARONE HCL IN DEXTROSE 360-4.14 MG/200ML-% IV SOLN
60.0000 mg/h | INTRAVENOUS | Status: AC
  Administered 2014-06-28: 60 mg/h via INTRAVENOUS

## 2014-06-28 MED ORDER — ALBUTEROL SULFATE (2.5 MG/3ML) 0.083% IN NEBU: 2.5000 mg | INHALATION_SOLUTION | RESPIRATORY_TRACT | Status: DC | PRN

## 2014-06-28 MED ORDER — BIOTENE DRY MOUTH MT LIQD: 15.0000 mL | OROMUCOSAL | Status: DC

## 2014-06-28 MED ORDER — ASPIRIN 300 MG RE SUPP
300.0000 mg | Freq: Once | RECTAL | Status: DC
  Filled 2014-06-28 (×2): qty 1

## 2014-06-28 MED ORDER — MIDAZOLAM HCL 2 MG/2ML IJ SOLN: 2.0000 mg | Freq: Once | INTRAMUSCULAR | Status: AC | PRN

## 2014-06-28 MED ORDER — SODIUM CHLORIDE 0.9 % IV SOLN
250.0000 mL | INTRAVENOUS | Status: DC | PRN
  Administered 2014-06-28: 22:00:00 via INTRAVENOUS

## 2014-06-28 MED ORDER — FENTANYL CITRATE 0.05 MG/ML IJ SOLN: 100.0000 ug | Freq: Once | INTRAMUSCULAR | Status: DC

## 2014-06-28 MED ORDER — MIDAZOLAM HCL 2 MG/2ML IJ SOLN
2.0000 mg | Freq: Once | INTRAMUSCULAR | Status: AC | PRN
Start: 2014-06-28 — End: 2014-06-28
  Administered 2014-06-28: 2 mg via INTRAVENOUS

## 2014-06-28 MED ORDER — BIOTENE DRY MOUTH MT LIQD: 15.0000 mL | Freq: Four times a day (QID) | OROMUCOSAL | Status: DC

## 2014-06-28 MED ORDER — LIDOCAINE HCL (PF) 1 % IJ SOLN
INTRAMUSCULAR | Status: AC
  Filled 2014-06-28: qty 30

## 2014-06-28 MED ORDER — NITROGLYCERIN IN D5W 200-5 MCG/ML-% IV SOLN
2.0000 ug/min | INTRAVENOUS | Status: DC
  Administered 2014-06-28: 15 ug/min via INTRAVENOUS

## 2014-06-28 MED ORDER — SODIUM CHLORIDE 0.9 % IV SOLN
INTRAVENOUS | Status: DC | PRN
  Administered 2014-06-28: 1000 mL via INTRAVENOUS

## 2014-06-28 MED ORDER — CISATRACURIUM BOLUS VIA INFUSION
0.0500 mg/kg | INTRAVENOUS | Status: DC | PRN
  Filled 2014-06-28: qty 4

## 2014-06-28 MED ORDER — CISATRACURIUM BESYLATE 10 MG/ML IV SOLN: 1.0000 ug/kg/min | INTRAVENOUS | Status: DC

## 2014-06-28 MED ORDER — SODIUM CHLORIDE 0.9 % IJ SOLN: 3.0000 mL | INTRAMUSCULAR | Status: DC | PRN

## 2014-06-28 MED ORDER — CETYLPYRIDINIUM CHLORIDE 0.05 % MT LIQD: 7.0000 mL | Freq: Four times a day (QID) | OROMUCOSAL | Status: DC

## 2014-06-28 MED ORDER — HEPARIN (PORCINE) IN NACL 100-0.45 UNIT/ML-% IJ SOLN
650.0000 [IU]/h | INTRAMUSCULAR | Status: DC
  Administered 2014-06-28: 650 [IU]/h via INTRAVENOUS
  Filled 2014-06-28: qty 250

## 2014-06-28 MED ORDER — CHLORHEXIDINE GLUCONATE 0.12 % MT SOLN
15.0000 mL | Freq: Two times a day (BID) | OROMUCOSAL | Status: DC
  Administered 2014-06-28 – 2014-07-04 (×12): 15 mL via OROMUCOSAL
  Filled 2014-06-28 (×12): qty 15

## 2014-06-28 MED ORDER — SODIUM CHLORIDE 0.9 % IV SOLN
1.0000 ug/kg/min | INTRAVENOUS | Status: DC
  Administered 2014-06-28 – 2014-06-29 (×3): 1 ug/kg/min via INTRAVENOUS
  Filled 2014-06-28 (×2): qty 20

## 2014-06-28 MED ORDER — MIDAZOLAM BOLUS VIA INFUSION
2.0000 mg | INTRAVENOUS | Status: DC | PRN
  Filled 2014-06-28: qty 2

## 2014-06-28 MED ORDER — SODIUM CHLORIDE 0.9 % IV SOLN: 25.0000 ug/h | INTRAVENOUS | Status: DC

## 2014-06-28 MED ORDER — HEPARIN BOLUS VIA INFUSION
2000.0000 [IU] | Freq: Once | INTRAVENOUS | Status: AC
  Administered 2014-06-28: 2000 [IU] via INTRAVENOUS
  Filled 2014-06-28: qty 2000

## 2014-06-28 MED ORDER — HYDRALAZINE HCL 20 MG/ML IJ SOLN
10.0000 mg | INTRAMUSCULAR | Status: DC | PRN
  Administered 2014-06-28: 10 mg via INTRAVENOUS
  Filled 2014-06-28: qty 1
  Filled 2014-06-28: qty 0.5

## 2014-06-28 MED ORDER — FENTANYL BOLUS VIA INFUSION: 50.0000 ug | INTRAVENOUS | Status: DC | PRN

## 2014-06-28 MED ORDER — AMIODARONE HCL IN DEXTROSE 360-4.14 MG/200ML-% IV SOLN
30.0000 mg/h | INTRAVENOUS | Status: DC
  Administered 2014-06-29 – 2014-06-30 (×4): 30 mg/h via INTRAVENOUS
  Filled 2014-06-28 (×8): qty 200

## 2014-06-28 MED ORDER — CISATRACURIUM BOLUS VIA INFUSION
0.1000 mg/kg | Freq: Once | INTRAVENOUS | Status: AC
Start: 2014-06-28 — End: 2014-06-28
  Administered 2014-06-28: 6.8 mg via INTRAVENOUS
  Filled 2014-06-28: qty 7

## 2014-06-28 MED ORDER — FENTANYL CITRATE 0.05 MG/ML IJ SOLN
INTRAMUSCULAR | Status: AC
  Filled 2014-06-28: qty 2

## 2014-06-28 MED ORDER — SODIUM CHLORIDE 0.9 % IJ SOLN: 3.0000 mL | Freq: Two times a day (BID) | INTRAMUSCULAR | Status: DC

## 2014-06-28 MED ORDER — FENTANYL BOLUS VIA INFUSION
50.0000 ug | INTRAVENOUS | Status: DC | PRN
  Filled 2014-06-28: qty 50

## 2014-06-28 NOTE — ED Notes (Signed)
All drugs verified with bast rn

## 2014-06-28 NOTE — Progress Notes (Signed)
ANTICOAGULATION CONSULT NOTE - Initial Consult  Pharmacy Consult for Heparin Indication: chest pain/ACS  Allergies not on file  Patient Measurements: Height: 5\' 9"  (175.3 cm) Weight: 150 lb (68.04 kg) IBW/kg (Calculated) : 70.7 Heparin Dosing Weight: 68 kg  Vital Signs: Temp: 97.4 F (36.3 C) (07/23 1200) Temp src: Temporal (07/23 1200) BP: 172/85 mmHg (07/23 1200) Pulse Rate: 77 (07/23 1200)  Labs: No results found for this basename: HGB, HCT, PLT, APTT, LABPROT, INR, HEPARINUNFRC, CREATININE, CKTOTAL, CKMB, TROPONINI,  in the last 72 hours  Estimated Creatinine Clearance: 45.2 ml/min (by C-G formula based on Cr of 1.19).   Medical History: History reviewed. No pertinent past medical history.  Medications:  Awaiting electronic med rec  Assessment: 80 y.o. male s/p witnessed collapse, CPR - shocked x3 for vfib, then to PEA (given epi x4, amio). ROSC. Pt now for hypothermia protocol. To begin heparin for ACS.  Goal of Therapy:  Heparin level 0.3-0.7 units/ml Monitor platelets by anticoagulation protocol: Yes   Plan:  1) Heparin IV bolus 2000 units 2) Heparin gtt at 650 units/hr 3) Will f/u 6 hr heparin level 4) Daily heparin level and CBC  Christoper Fabian, PharmD, BCPS Clinical pharmacist, pager [phone] July 06, 2014,12:14 PM

## 2014-06-28 NOTE — Consult Note (Addendum)
CARDIOLOGY CONSULT NOTE  Patient ID: Bryan Long, MRN: [id number], DOB/AGE: 1929/01/26 80 y.o. Admit date: 07/03/2014 Date of Consult: 06/11/2014  Primary Physician: Johny Blamer, MD Primary Cardiologist: Dr Donnie Aho Referring Physician: Dr Micheline Maze  Chief Complaint: Out-of-hospital arrest Reason for Consultation: same  HPI: This is an 80 year old gentleman who was in his normal state of health this morning. He was out cutting his grass and his wife witnessed him fall to the ground. She ran out to him, and he was unconscious. She called EMS who arrived promptly. The patient's wife states that they live right around the corner from the fire department. The patient was defibrillated 3 times and received approximately 15 minutes of CPR. He regained a pulse and was transported to the emergency department. After his arrival, cardiology was consulted to consider early cardiac catheterization.  The patient has been followed for a heart murmur. He's had previous carotid endarterectomy. He takes a medicine for cholesterol, but is not on any antiplatelet or anticoagulant therapies. Apparently, he had some evidence of GI bleeding on aspirin as he was "spitting up small amounts of blood." He stopped aspirin on his own. He had an echocardiogram last week at Dr. York Spaniel office but the family did not know the results of this. He was told that he has a "leaky valve."  Past Medical History  Diagnosis Date  . Hyperlipidemia   . Peripheral vascular disease       Surgical History:  Past Surgical History  Procedure Laterality Date  . Carotid endarterectomy       Home Meds: Prior to Admission medications   Not on File    Inpatient Medications:  . [facility] HOLD] artificial tears  1 application Both Eyes 3 times per day  . [facility] HOLD] aspirin  300 mg Rectal Once  . [facility] HOLD] fentaNYL  100 mcg Intravenous Once  . [facility] HOLD] fentaNYL  100 mcg Intravenous Once  . [[facility] HOLD] insulin aspart  0-9 Units  Subcutaneous 6 times per day  . [[facility] HOLD] midazolam  2 mg Intravenous Once  . pantoprazole (PROTONIX) IV  40 mg Intravenous Q24H   . [[facility] HOLD] sodium chloride    . cisatracurium (NIMBEX) infusion 1 mcg/kg/min (06/09/2014 1236)  . fentaNYL infusion INTRAVENOUS 50 mcg/hr (06/13/2014 1238)  . heparin 650 Units/hr (06/22/2014 1240)  . midazolam (VERSED) infusion 1 mg/hr (06/13/2014 1239)  . [[facility] HOLD] norepinephrine (LEVOPHED) Adult infusion      Allergies: No Known Allergies  History   Social History  . Marital Status: Married    Spouse Name: N/A    Number of Children: N/A  . Years of Education: N/A   Occupational History  . Not on file.   Social History Main Topics  . Smoking status: Not on file  . Smokeless tobacco: Not on file  . Alcohol Use: Not on file  . Drug Use: Not on file  . Sexual Activity: Not on file   Other Topics Concern  . Not on file   Social History Narrative  . No narrative on file     Family hx: no premature CAD  Review of Systems: Unable to obtain  Physical Exam: Blood pressure 168/59, pulse 65, temperature 96.6 F (35.9 C), temperature source Temporal, resp. rate 16, height 5\' 9"  (1.753 m), weight 150 lb (68.04 kg), SpO2 100.00%. General: Elderly male, intubated, unresponsive  HEENT: Normocephalic, atraumatic Neck: Carotids 2+ . Unable to visualize JVP Lungs: Clear bilaterally to auscultation without wheezes, rales, or  rhonchi. Breathing is unlabored. Heart: RRR with normal S1 and S2. There is a grade 3/6 mid peaking systolic murmur best heard at the left lower sternal border Abdomen: Soft, with normoactive bowel sounds. No hepatomegaly. The aorta pulsation is prominent. No obvious abdominal masses. Back: No deformity. Msk:  Strength and tone appear normal for age. Extremities: No clubbing, cyanosis, or edema.  Distal pedal pulses are 2+ and equal bilaterally. Neuro: unresponsive to verbal or pain stimuli     Labs:  Recent Labs   Jun 18, 2014 1200  TROPONINI 0.38*   Lab Results  Component Value Date   WBC 17.3* August 11, 2014   HGB 10.5* August 11, 2014   HCT 32.3* August 11, 2014   MCV 92.6 August 11, 2014   PLT 202 August 11, 2014   No results found for this basename: NA, K, CL, CO2, BUN, CREATININE, CALCIUM, LABALBU, PROT, BILITOT, ALKPHOS, ALT, AST, GLUCOSE,  in the last 168 hours No results found for this basename: CHOL, HDL, LDLCALC, TRIG   No results found for this basename: DDIMER    Radiology/Studies:  Dg Chest Port 1 View  August 11, 2014   CLINICAL DATA:  Cardiac arrest.  Endotracheal tube placement.  EXAM: PORTABLE CHEST - 1 VIEW  COMPARISON:  03/01/2014.  FINDINGS: Support apparatus: Endotracheal tube tip is 9 cm from the carina, just inferior to the thoracic inlet. Enteric tube is present with the tip not visualized. Defibrillator pads overlie the chest. Monitoring leads project over the chest.  Cardiomediastinal Silhouette:  Normal.  Lungs: Clear.  No pneumothorax.  Effusions: None visible. Extreme costophrenic angles excluded from view.  Other:  Bilateral AC joint osteoarthritis.  IMPRESSION: 1. Support apparatus as described above. The endotracheal tube could be advanced 2 cm for more secure positioning. 2. No active cardiopulmonary disease.   Electronically Signed   By: Andreas NewportGeoffrey  Lamke M.D.   On: 0September 05, 2015 12:24    EKG: NSR with RBBB, nonspecific ST change  Cardiac Studies: pending  ASSESSMENT AND PLAN:  1. Out of hospital VF arrest 2. NSTEMI 3. Heart murmur likely aortic stenosis 4. Carotid arterial disease s/p CEA 5. Lower extremity PAD  I discussed the patient's case in detail with Dr Micheline Mazeocherty (EDP) and Dr Vassie LollAlva (CCM). History reviewed carefully with the patient's family. Considering his reasonably good functional capacity and initial heart rhythm of ventricular fibrillation, I would favor early coronary angiography with a knife toward PCI if there is a culprit lesion. I explained to the family that the patient has a high  risk of coronary artery disease considering his age of 80 years, known peripheral arterial disease, and murmur of aortic stenosis. I have reviewed the risks, indications, and alternatives of cardiac catheterization and possible PCI with the patient's family. They understand and agree to proceed. The patient will undergo therapeutic hypothermia. We will continue with supportive measures  through the CCM team. Further plans pending cardiac cath results.  Signed, Tonny BollmanMichael Cajas MD August 11, 2014, 1:12 PM

## 2014-06-28 NOTE — Progress Notes (Signed)
eLink Physician-Brief Progress Note Patient Name: Bryan Long DOB: 05/23/1929 MRN: [id number]  Date of Service  06/08/2014   HPI/Events of Note   Shock, cvp 1 K 3.5  eICU Interventions  Bolus saline now KCL replacement   Intervention Category Major Interventions: Hypotension - evaluation and management;Shock - evaluation and management Minor Interventions: Electrolytes abnormality - evaluation and management  Bryan Long 07/05/2014, 8:29 PM

## 2014-06-28 NOTE — ED Notes (Signed)
Pt here by [facility] ems for post cpr, pt wife witness pt collapse and was found unresponsive pulseless and apneic, cpr was initiated by fire, with ems pt shocked three times  Total for V fib, and then in PEA, given 4 epi, 300 mg amiodarone, 1 amp d50 and 2 mg narcan, pt had ett placed  23 at gum prior to arrival to ed. Cap was 20-40 with ems, Cold Saline initiated prior to arrival by ems. Per family ems sts that pt is healthy and has  A check up for cardiology next week. Hr 70-120 enroute. Pulse present on arrival to trauma c

## 2014-06-28 NOTE — Progress Notes (Addendum)
ANTICOAGULATION CONSULT NOTE - Follow Up Consult  Pharmacy Consult for heparin Indication: chest pain/ACS  No Known Allergies  Patient Measurements: Height: 5\' 9"  (175.3 cm) Weight: 150 lb (68.04 kg) IBW/kg (Calculated) : 70.7   Vital Signs: Temp: 96.6 F (35.9 C) (07/23 1230) Temp src: Temporal (07/23 1200) BP: 168/59 mmHg (07/23 1230) Pulse Rate: 83 (07/23 1308)  Labs:  Recent Labs  06/29/2014 1200 06/18/2014 1238  HGB 10.5*  --   HCT 32.3*  --   PLT 202  --   APTT  --  32  LABPROT  --  16.5*  INR  --  1.33  CREATININE  --  1.16  TROPONINI 0.38*  --     Estimated Creatinine Clearance: 46.4 ml/min (by C-G formula based on Cr of 1.16).   Medications:  Scheduled:  . artificial tears  1 application Both Eyes 3 times per day  . aspirin  300 mg Rectal Once  . fentaNYL  100 mcg Intravenous Once  . fentaNYL  100 mcg Intravenous Once  . insulin aspart  0-9 Units Subcutaneous 6 times per day  . midazolam  2 mg Intravenous Once  . pantoprazole (PROTONIX) IV  40 mg Intravenous Q24H   Infusions:  . sodium chloride    . cisatracurium (NIMBEX) infusion 1 mcg/kg/min (07/05/2014 1236)  . fentaNYL infusion INTRAVENOUS 50 mcg/hr (06/06/2014 1238)  . heparin 650 Units/hr (06/12/2014 1240)  . midazolam (VERSED) infusion 1 mg/hr (06/26/2014 1239)  . nitroGLYCERIN    . norepinephrine (LEVOPHED) Adult infusion      Assessment: 80 yo male w/p VF arrest and NSTEMI now s/p cath with severe multivessel CAD to start heparin 8 hrs post sheath removal (sheath removed at 1:39pm today). Heparin was started precath at 650 units/hr (no heparin levels available). Patient noted on hypothermia protocol  Goal of Therapy:  Heparin level 0.3-0.7 units/ml Monitor platelets by anticoagulation protocol: Yes   Plan:  -Will restart heparin at 650 units/hr 8 hours post sheath removal (about 10pm) -Heparin level daily with CBC daily  [name], Pharm D 06/29/2014 2:24 PM

## 2014-06-28 NOTE — Care Management Note (Signed)
    Page 1 of 1   07/03/2014     2:12:31 PM CARE MANAGEMENT NOTE 06/12/2014  Patient:  Bryan Long, Bryan Long   Account Number:  [id number]  Date Initiated:  06/18/2014  Documentation initiated by:  Junius Creamer  Subjective/Objective Assessment:   adm w mi     Action/Plan:   lives w wife, pcp dr Johny Blamer   Anticipated DC Date:     Anticipated DC Plan:           Choice offered to / List presented to:             Status of service:   Medicare Important Message given?   (If response is "NO", the following Medicare IM given date fields will be blank) Date Medicare IM given:   Medicare IM given by:   Date Additional Medicare IM given:   Additional Medicare IM given by:    Discharge Disposition:    Per UR Regulation:  Reviewed for med. necessity/level of care/duration of stay  If discussed at Long Length of Stay Meetings, dates discussed:    Comments:

## 2014-06-28 NOTE — Procedures (Signed)
Central Venous Catheter Insertion Procedure Note Bryan Long [id number] 10-01-1929  Procedure: Insertion of Central Venous Catheter Indications: Assessment of intravascular volume, Drug and/or fluid administration and Frequent blood sampling  Procedure Details Consent: Risks of procedure as well as the alternatives and risks of each were explained to the (patient/caregiver).  Consent for procedure obtained. Time Out: Verified patient identification, verified procedure, site/side was marked, verified correct patient position, special equipment/implants available, medications/allergies/relevent history reviewed, required imaging and test results available.  Performed  Maximum sterile technique was used including antiseptics, cap, gloves, gown, hand hygiene, mask and sheet. Skin prep: Chlorhexidine; local anesthetic administered A antimicrobial bonded/coated triple lumen catheter was placed in the left internal jugular vein using the Seldinger technique.  Evaluation Blood flow good Complications: No apparent complications Patient did tolerate procedure well. Chest X-ray ordered to verify placement.  CXR: pending.   Procedure performed under direct supervision of Dr. Marin Shutter and with ultrasound guidance for real time vessel cannulation.     Bryan Brim, NP-C [facility] Pgr: [phone] or [phone]    07/01/2014, 3:21 PM

## 2014-06-28 NOTE — Procedures (Signed)
Arterial Catheter Insertion Procedure Note Bryan Long [id number] 08-23-1929  Procedure: Insertion of Arterial Catheter  Indications: Blood pressure monitoring  Procedure Details Consent: Risks of procedure as well as the alternatives and risks of each were explained to the (patient/caregiver).  Consent for procedure obtained. Time Out: Verified patient identification, verified procedure, site/side was marked, verified correct patient position, special equipment/implants available, medications/allergies/relevent history reviewed, required imaging and test results available.  Performed  Maximum sterile technique was used including antiseptics, cap, gloves, gown, hand hygiene, mask and sheet. Skin prep: Chlorhexidine; local anesthetic administered 20 gauge catheter was inserted into right radial artery using the Seldinger technique.  Evaluation Blood flow good; BP tracing good. Complications: No apparent complications.   Bryan Long 07/12/14

## 2014-06-28 NOTE — Progress Notes (Signed)
I/O removed, per order, from RLE using aseptic technique. Pt. Tolerated well. Occlusive dressing (vaseline guaze and 4x4) placed & C,D,I. Daughter and RN at bedside.   Gordy Levan, RN IV Team

## 2014-06-28 NOTE — CV Procedure (Signed)
   Cardiac Catheterization Procedure Note  Name: Bryan Long MRN: [id number] DOB: 1929/07/08  Procedure: Left Heart Cath, Selective Coronary Angiography, LV angiography  Indication: Out-of-hospital cardiac arrest, initial rhythm VF  Procedural details: The right groin was prepped, draped, and anesthetized with 1% lidocaine. Using modified Seldinger technique, a 5 French sheath was introduced into the right femoral artery. Standard Judkins catheters were used for coronary angiography and left ventriculography. Catheter exchanges were performed over a guidewire. There were no immediate procedural complications. The patient was transferred to the post catheterization recovery area for further monitoring.  Procedural Findings: Hemodynamics:  AO 214/38 LV 193/86   Coronary angiography: Coronary dominance: right  Left mainstem: The left main is heavily calcified. There is diffuse 30-40% stenosis. There is no high-grade obstruction of the left main.  Left anterior descending (LAD): The LAD is severely calcified. The vessel has at least 80% stenosis proximally involving the origin of the large first diagonal branch which also has 80% stenosis. The mid and distal LAD as the vessel wraps around the apex it is patent. A there are extensive septal perforators supplying collaterals to a distal circumflex branch.  Left circumflex (LCx): The left circumflex has delayed filling. There is 75-80% ostial stenosis. The mid circumflex has 95% stenosis leading into an OM branch. The left circumflex is dominant and there is late filling of the left PDA branch.  Right coronary artery (RCA): The RCA is nondominant. The vessel has 80% proximal stenosis. There are some septal collaterals provided by the conus branch. There is also a collateral to the distal left circumflex distribution, probably a posterolateral branch. The RCA is heavily calcified.  Left ventriculography: There is severe segmental LV systolic  dysfunction. The entire inferior wall is akinetic. The aortic valve is heavily calcified with restricted opening. There appears to be moderate to severe mitral regurgitation. The estimated LVEF is 25%.  Final Conclusions:   1. Severe multivessel coronary artery disease with severe stenosis of the LAD, and critical stenosis of the left circumflex which is a dominant vessel. Extensive collaterals as described.  2. Severe segmental LV systolic dysfunction with severely elevated LVEDP, aortic stenosis, and mitral regurgitation.  Recommendations: The patient is critically ill after out of hospital cardiac arrest. He has advanced cardiac disease. Will start him on IV nitroglycerin to treat his severe hypertension. Will check an echocardiogram. Recommend supportive care while he is undergoing hypothermia. Further decisions regarding cardiac treatment options pending his neurologic status.  Tonny Bollman 06/12/2014, 1:55 PM

## 2014-06-28 NOTE — ED Provider Notes (Signed)
CSN: [id number]     Arrival date & time 06/20/2014  1144 History   First MD Initiated Contact with Patient 06/18/2014 1156     Chief Complaint  Patient presents with  . Cardiac Arrest     (Consider location/radiation/quality/duration/timing/severity/associated sxs/prior Treatment) HPI Comments: Pt here by [facility] ems for post cpr, pt wife witness pt collapse and was found unresponsive pulseless and apneic, cpr was initiated by fire, with ems pt shocked three times total for V fib which was initial rhythm and then in PEA, given 4 epi, 300 mg amiodarone, 1 amp d50 and 2 mg narcan. Pt had ett placed  23cm at gum prior to arrival to ED by EMS. Cap was 20-40 with ems, Cold Saline initiated. He is breathing over vent, otherwise unresponsive.  Patient is a 80 y.o. male presenting with altered mental status. The history is provided by the EMS personnel. The history is limited by the condition of the patient and the absence of a caregiver. No language interpreter was used.  Altered Mental Status Presenting symptoms: unresponsiveness   Severity:  Severe Most recent episode:  Today Episode history:  Single Duration:  20 minutes Timing:  Constant Progression:  Unchanged Chronicity:  New   Past Medical History  Diagnosis Date  . Hyperlipidemia   . Peripheral vascular disease    Past Surgical History  Procedure Laterality Date  . Carotid endarterectomy     History reviewed. No pertinent family history. History  Substance Use Topics  . Smoking status: Not on file  . Smokeless tobacco: Not on file  . Alcohol Use: Not on file    Review of Systems  Unable to perform ROS     Allergies  Review of patient's allergies indicates no known allergies.  Home Medications   Prior to Admission medications   Medication Sig Start Date End Date Taking? Authorizing Provider  atorvastatin (LIPITOR) 20 MG tablet Take 20 mg by mouth daily.   Yes Historical Provider, MD   BP 116/38  Pulse 41  Temp(Src)  89.8 F (32.1 C) (Core (Comment))  Resp 16  Ht 5\' 9"  (1.753 m)  Wt 150 lb (68.04 kg)  BMI 22.14 kg/m2  SpO2 99% Physical Exam  Constitutional: He appears well-developed and well-nourished. He appears distressed.  HENT:  Head: Normocephalic and atraumatic.  Mouth/Throat: No oropharyngeal exudate.  Eyes:  34mm sluggish  Neck: Normal range of motion. Neck supple.  Cardiovascular: Normal rate and normal heart sounds.  An irregular rhythm present. Exam reveals no gallop and no friction rub.   No murmur heard. Pulmonary/Chest: Effort normal and breath sounds normal. No respiratory distress. He has no wheezes. He has no rales.  Abdominal: Soft. Bowel sounds are normal. He exhibits no distension and no mass. There is no tenderness. There is no rebound and no guarding.  Musculoskeletal: Normal range of motion. He exhibits no edema and no tenderness.  Neurological: He is unresponsive. GCS eye subscore is 1. GCS verbal subscore is 1. GCS motor subscore is 1.  Skin: Skin is warm and dry.  Psychiatric: He has a normal mood and affect.    ED Course  Procedures (including critical care time) Labs Review Labs Reviewed  CBC WITH DIFFERENTIAL - Abnormal; Notable for the following:    WBC 17.3 (*)    RBC 3.49 (*)    Hemoglobin 10.5 (*)    HCT 32.3 (*)    RDW 16.4 (*)    Neutro Abs 10.0 (*)    Lymphs Abs 5.6 (*)  Monocytes Absolute 1.1 (*)    All other components within normal limits  COMPREHENSIVE METABOLIC PANEL - Abnormal; Notable for the following:    CO2 14 (*)    Glucose, Bld 295 (*)    Calcium 7.9 (*)    Albumin 2.8 (*)    AST 175 (*)    ALT 150 (*)    GFR calc non Af Amer 56 (*)    GFR calc Af Amer 65 (*)    Anion gap 24 (*)    All other components within normal limits  URINALYSIS, ROUTINE W REFLEX MICROSCOPIC - Abnormal; Notable for the following:    APPearance CLOUDY (*)    Hgb urine dipstick SMALL (*)    Protein, ur 100 (*)    Leukocytes, UA SMALL (*)    All other  components within normal limits  TROPONIN I - Abnormal; Notable for the following:    Troponin I 0.38 (*)    All other components within normal limits  PRO B NATRIURETIC PEPTIDE - Abnormal; Notable for the following:    Pro B Natriuretic peptide (BNP) 4283.0 (*)    All other components within normal limits  PROTIME-INR - Abnormal; Notable for the following:    Prothrombin Time 16.5 (*)    All other components within normal limits  LACTIC ACID, PLASMA - Abnormal; Notable for the following:    Lactic Acid, Venous 5.6 (*)    All other components within normal limits  URINE MICROSCOPIC-ADD ON - Abnormal; Notable for the following:    Casts GRANULAR CAST (*)    All other components within normal limits  GLUCOSE, CAPILLARY - Abnormal; Notable for the following:    Glucose-Capillary 267 (*)    All other components within normal limits  I-STAT TROPOININ, ED - Abnormal; Notable for the following:    Troponin i, poc 0.16 (*)    All other components within normal limits  POCT I-STAT 3, ART BLOOD GAS (G3+) - Abnormal; Notable for the following:    pH, Arterial 7.343 (*)    pO2, Arterial 519.0 (*)    Acid-base deficit 4.0 (*)    All other components within normal limits  POCT I-STAT, CHEM 8 - Abnormal; Notable for the following:    Glucose, Bld 194 (*)    Calcium, Ion 1.06 (*)    Hemoglobin 12.2 (*)    HCT 36.0 (*)    All other components within normal limits  URINE CULTURE  MRSA PCR SCREENING  APTT  PROTIME-INR  APTT  BASIC METABOLIC PANEL  BASIC METABOLIC PANEL  BASIC METABOLIC PANEL  BASIC METABOLIC PANEL  BASIC METABOLIC PANEL  BASIC METABOLIC PANEL  CBC  HEPARIN LEVEL (UNFRACTIONATED)  POCT ACTIVATED CLOTTING TIME    Imaging Review Ct Head Wo Contrast  06/06/2014   CLINICAL DATA:  Cardiac arrest  EXAM: CT HEAD WITHOUT CONTRAST  TECHNIQUE: Contiguous axial images were obtained from the base of the skull through the vertex without intravenous contrast.  COMPARISON:  None.   FINDINGS: Generalized atrophy. Negative for acute infarct. Negative for hemorrhage or mass. Calvarium intact.  IMPRESSION: No acute abnormality.   Electronically Signed   By: Marlan Palauharles  Clark M.D.   On: 06/23/2014 13:20   Dg Chest Port 1 View  06/06/2014   CLINICAL DATA:  Status post line insertion.  EXAM: PORTABLE CHEST - 1 VIEW  COMPARISON:  Same day.  FINDINGS: Stable cardiomediastinal silhouette. Endotracheal and nasogastric tubes are unchanged in position. No pneumothorax or pleural effusion is noted.  Mild bilateral perihilar interstitial densities are noted concerning for edema or central pulmonary vascular congestion. Interval placement of left internal jugular catheter line with distal tip in the expected position of the SVC. Bony thorax is intact.  IMPRESSION: Interval placement of left internal jugular catheter line with distal tip in expected position of SVC. No pneumothorax is noted. Mild bilateral perihilar interstitial densities are noted concerning for edema or pulmonary vascular congestion.   Electronically Signed   By: Roque Lias M.D.   On: 06/11/2014 15:38   Dg Chest Port 1 View  06/08/2014   CLINICAL DATA:  Cardiac arrest.  Endotracheal tube placement.  EXAM: PORTABLE CHEST - 1 VIEW  COMPARISON:  03/01/2014.  FINDINGS: Support apparatus: Endotracheal tube tip is 9 cm from the carina, just inferior to the thoracic inlet. Enteric tube is present with the tip not visualized. Defibrillator pads overlie the chest. Monitoring leads project over the chest.  Cardiomediastinal Silhouette:  Normal.  Lungs: Clear.  No pneumothorax.  Effusions: None visible. Extreme costophrenic angles excluded from view.  Other:  Bilateral AC joint osteoarthritis.  IMPRESSION: 1. Support apparatus as described above. The endotracheal tube could be advanced 2 cm for more secure positioning. 2. No active cardiopulmonary disease.   Electronically Signed   By: Andreas Newport M.D.   On: 06/22/2014 12:24     EKG  Interpretation   Date/Time:  Thursday June 28 2014 11:53:23 EDT Ventricular Rate:  87 PR Interval:  167 QRS Duration: 131 QT Interval:  428 QTC Calculation: 515 R Axis:   132 Text Interpretation:  Sinus rhythm Atrial premature complex Right bundle  branch block ST depr, consider ischemia, anterolateral lds Confirmed by  DOCHERTY  MD, MEGAN ([id number]) on 07/02/2014 12:11:25 PM     CRITICAL CARE Performed by: Toy Cookey, E Total critical care time: 30 mins Critical care time was exclusive of separately billable procedures and treating other patients. Critical care was necessary to treat or prevent imminent or life-threatening deterioration. Critical care was time spent personally by me on the following activities: development of treatment plan with patient and/or surrogate as well as nursing, discussions with consultants, evaluation of patient's response to treatment, examination of patient, obtaining history from patient or surrogate, ordering and performing treatments and interventions, ordering and review of laboratory studies, ordering and review of radiographic studies, pulse oximetry and re-evaluation of patient's condition.  MDM   Final diagnoses:  NSTEMI (non-ST elevated myocardial infarction)  Ventricular fibrillation  Cardiac arrest    Pt is a 80 y.o. male with Pmhx as above who presents with cardiac arrest and ROSC. Per EMS, pt collapsed on the front porch. Fire on screen shortly after, beginning CPR. Initial rhythm by EMS was Vfib. En route, pt defib x3, given 4x 1mg  epi, 300mg  amio, narca, d50. Upon arrival ,pt GCS 3, but breathing over vent. ETT placement clinically confirmed by myself.  Initial HR in 70-80's irregular.  SBP 170's. EKG w/ ST depression in lateral leads. Arctic sun protocol iniated with ACs treatment dose heparin, rectal ASA, and amio gtt. CCM and cardiology consulted. Brief discussion held with family to given overview of current condition before introducing  family to cardiology and CCM.         Shanna Cisco, MD 06/19/2014 [phone]

## 2014-06-28 NOTE — H&P (Signed)
PULMONARY / CRITICAL CARE MEDICINE   Name: Bryan Long MRN: [id number] DOB: Apr 24, 1929    ADMISSION DATE:  07/03/2014   REFERRING MD :  EDP   CHIEF COMPLAINT:  Cardiac Arrest - Initial Rhythm VF  INITIAL PRESENTATION: 80 y/o M who presented to [facility] ER on 7/23 after VF arrest at home.  Approximate 15 minute total downtime.  Intubated in field, required shock x3 + meds & PCCM consulted for admission.   STUDIES:  7/23 - CT HEAD >> 7/23 - Cardiac Cath >> severe multivessel CAD with severe stenosis of LAD, critical stenosis of LCx, extensive collaterals  SIGNIFICANT EVENTS: 7/23 - Admit after VF arrest, 15 min downtime.  To Cath lab + hypothermia protocol    HISTORY OF PRESENT ILLNESS:  80 y/o M who presented to [facility] ER on 7/23 after VF arrest at home. Wife reports patient was outside mowing the lawn when she heard him collapse.  She attempted to arouse him and tried to perform CPR.  She then called EMS.  Patient had an  approximate 15 minute total downtime.  Fire arrived on scene and patient was pulseless with no respirations.  He was intubated in the field, required shock x3 + meds.  He was also given Narcan in the field without response.  Wife reports he was in his usual state of health without complaints as of 7/22. He has noted some weight loss over the past few months and fatigue.    ER evaluation noted the patient to be obtunded without sedation.  CT of the head was negative for acute bleed.  He was taken directly to the cath lab from ER for evaluation.  Patient was noted to have severe multivessel disease and returned to ICU.  He was noted to have significant hypertension during cath and post.       PAST MEDICAL HISTORY :  Past Medical History  Diagnosis Date  . Hyperlipidemia   . Peripheral vascular disease    Past Surgical History  Procedure Laterality Date  . Carotid endarterectomy     Prior to Admission medications   Not on File   No Known Allergies  FAMILY HISTORY:   History reviewed. No pertinent family history.  SOCIAL HISTORY:  has no tobacco, alcohol, and drug history on file.  REVIEW OF SYSTEMS:  Unable to complete as patient is altered on vent.    SUBJECTIVE:   VITAL SIGNS: Temp:  [95.9 F (35.5 C)-97.4 F (36.3 C)] 96.6 F (35.9 C) (07/23 1230) Pulse Rate:  [59-83] 59 (07/23 1405) Resp:  [16-21] 21 (07/23 1405) BP: (153-196)/(59-85) 168/59 mmHg (07/23 1230) SpO2:  [100 %] 100 % (07/23 1405) FiO2 (%):  [40 %-100 %] 40 % (07/23 1405) Weight:  [150 lb (68.04 kg)] 150 lb (68.04 kg) (07/23 1200)  HEMODYNAMICS:    VENTILATOR SETTINGS: Vent Mode:  [-] PRVC FiO2 (%):  [40 %-100 %] 40 % Set Rate:  [16 bmp] 16 bmp Vt Set:  [560 mL-580 mL] 580 mL PEEP:  [5 cmH20] 5 cmH20 Plateau Pressure:  [13 cmH20-19 cmH20] 19 cmH20  INTAKE / OUTPUT: No intake or output data in the 24 hours ending 06/09/2014 1509  PHYSICAL EXAMINATION: General:  wdwn elderly male in NAD Neuro:  (pprior to sedation) comatose on vent, pupils 69mm BERTL, no response to verbal/pain stimuli, GCS3 HEENT:  OETT, mm pink/moist, no jvd Cardiovascular:  s1s2 rrr, SEM 3/6  Lungs:  resp's even/non-labored on vent, lungs bilaterally clear Abdomen:  bounding aortic pulsation,  soft, non-tender Musculoskeletal:  No acute deformities, C-Collar in place  Skin:  Warm/dry, no edema  LABS:  CBC  Recent Labs Lab 11-01-2014 1200  WBC 17.3*  HGB 10.5*  HCT 32.3*  PLT 202   Coag's  Recent Labs Lab 11-01-2014 1238  APTT 32  INR 1.33   BMET  Recent Labs Lab 11-01-2014 1238  NA 138  K 4.5  CL 100  CO2 14*  BUN 19  CREATININE 1.16  GLUCOSE 295*   Electrolytes  Recent Labs Lab 11-01-2014 1238  CALCIUM 7.9*   Sepsis Markers  Recent Labs Lab 11-01-2014 1238  LATICACIDVEN 5.6*   ABG No results found for this basename: PHART, PCO2ART, PO2ART,  in the last 168 hours Liver Enzymes  Recent Labs Lab 11-01-2014 1238  AST 175*  ALT 150*  ALKPHOS 83  BILITOT 0.8   ALBUMIN 2.8*   Cardiac Enzymes  Recent Labs Lab 11-01-2014 1200  TROPONINI 0.38*  PROBNP 4283.0*   Glucose No results found for this basename: GLUCAP,  in the last 168 hours  Imaging No results found.   ASSESSMENT / PLAN:  PULMONARY OETT 7/23 >> A: Acute Respiratory Failure - in the setting of VF arrest  P:   Full support, 8cc/kg Follow ABG  Trend CXR PRN BD's  CARDIOVASCULAR CVL L IJ 7/23 >> A:  VF Arrest - approx 15 minute downtime Suspected Valvular Disease - ? AS Hypertension Hyperlipidemia EF25% by cath P:  Hypothermia protocol NTG gtt PRN Hydralazine 1 x dose labetalol  PRN pressors if needed to maintain MAP >85 Amiodarone gtt Trend CVP Assess ECHO   RENAL A:   At risk AKI  P:   Ensure adequate perfusion  Control BP  Trend BMP  GASTROINTESTINAL A:   Vent Associated Dysphagia  Elevated LFT's - in setting of arrest  P:   NPO PPI  Consider TF post rewarm Trend LFT's  HEMATOLOGIC A:   Anemia  P:  DVT: Heparin gtt  INFECTIOUS A:   No acute infectious process.  P:   Monitor fever curve, leukocytosis  ENDOCRINE A:   Hyperglycemia    P:   SSI   NEUROLOGIC A:   Acute Encephalopathy - in setting of VF arrest  P:   RASS goal: -3 to -4  Sedation + paralytics per hypothermia protocol   TODAY'S SUMMARY:  80 y/o M admitted 7/23 post VF arrest.  To cath lab with severe multivessel disease.  ICU admit for hypothermia protocol.   Canary BrimBrandi Ollis, NP-C [facility] Pgr: [phone] or [phone]  Global - Discussed guarded prognosis & explained hypothermia protocol to family, care co-ordinated with Dr Copper  I have personally obtained a history, examined the patient, evaluated laboratory and imaging results, formulated the assessment and plan and placed orders.  CRITICAL CARE: The patient is critically ill with multiple organ systems failure and requires high complexity decision making for assessment and support,  frequent evaluation and titration of therapies, application of advanced monitoring technologies and extensive interpretation of multiple databases. Critical Care Time devoted to patient care services described in this note is 60 minutes.   Oretha MilchALVA,RAKESH V. MD    Dec 23, 2013, 3:09 PM

## 2014-06-29 DIAGNOSIS — I359 Nonrheumatic aortic valve disorder, unspecified: Secondary | ICD-10-CM

## 2014-06-29 LAB — URINE CULTURE
CULTURE: NO GROWTH
Colony Count: NO GROWTH

## 2014-06-29 LAB — BASIC METABOLIC PANEL
ANION GAP: 14 (ref 5–15)
ANION GAP: 13 (ref 5–15)
ANION GAP: 17 — AB (ref 5–15)
ANION GAP: 14 (ref 5–15)
Anion gap: 16 — ABNORMAL HIGH (ref 5–15)
Anion gap: 16 — ABNORMAL HIGH (ref 5–15)
BUN: 20 mg/dL (ref 6–23)
BUN: 20 mg/dL (ref 6–23)
BUN: 18 mg/dL (ref 6–23)
BUN: 19 mg/dL (ref 6–23)
BUN: 20 mg/dL (ref 6–23)
BUN: 20 mg/dL (ref 6–23)
CHLORIDE: 104 meq/L (ref 96–112)
CHLORIDE: 104 meq/L (ref 96–112)
CHLORIDE: 105 meq/L (ref 96–112)
CHLORIDE: 107 meq/L (ref 96–112)
CO2: 17 mEq/L — ABNORMAL LOW (ref 19–32)
CO2: 17 mEq/L — ABNORMAL LOW (ref 19–32)
CO2: 17 meq/L — AB (ref 19–32)
CO2: 17 meq/L — AB (ref 19–32)
CO2: 18 mEq/L — ABNORMAL LOW (ref 19–32)
CO2: 18 mEq/L — ABNORMAL LOW (ref 19–32)
CREATININE: 1.08 mg/dL (ref 0.50–1.35)
Calcium: 6.9 mg/dL — ABNORMAL LOW (ref 8.4–10.5)
Calcium: 7 mg/dL — ABNORMAL LOW (ref 8.4–10.5)
Calcium: 7.2 mg/dL — ABNORMAL LOW (ref 8.4–10.5)
Calcium: 7.2 mg/dL — ABNORMAL LOW (ref 8.4–10.5)
Calcium: 7.2 mg/dL — ABNORMAL LOW (ref 8.4–10.5)
Calcium: 7.3 mg/dL — ABNORMAL LOW (ref 8.4–10.5)
Chloride: 107 mEq/L (ref 96–112)
Chloride: 105 mEq/L (ref 96–112)
Creatinine, Ser: 0.98 mg/dL (ref 0.50–1.35)
Creatinine, Ser: 0.99 mg/dL (ref 0.50–1.35)
Creatinine, Ser: 1 mg/dL (ref 0.50–1.35)
Creatinine, Ser: 1.02 mg/dL (ref 0.50–1.35)
Creatinine, Ser: 1.05 mg/dL (ref 0.50–1.35)
GFR calc Af Amer: 78 mL/min — ABNORMAL LOW (ref >90)
GFR calc Af Amer: 85 mL/min — ABNORMAL LOW (ref >90)
GFR calc non Af Amer: 61 mL/min — ABNORMAL LOW (ref >90)
GFR calc non Af Amer: 64 mL/min — ABNORMAL LOW (ref >90)
GFR calc non Af Amer: 67 mL/min — ABNORMAL LOW (ref >90)
GFR calc non Af Amer: 74 mL/min — ABNORMAL LOW (ref >90)
GFR, EST AFRICAN AMERICAN: 76 mL/min — AB (ref >90)
GFR, EST AFRICAN AMERICAN: 74 mL/min — AB (ref >90)
GFR, EST AFRICAN AMERICAN: 86 mL/min — AB (ref >90)
GFR, EST AFRICAN AMERICAN: 71 mL/min — AB (ref >90)
GFR, EST NON AFRICAN AMERICAN: 74 mL/min — AB (ref >90)
GFR, EST NON AFRICAN AMERICAN: 66 mL/min — AB (ref >90)
Glucose, Bld: 116 mg/dL — ABNORMAL HIGH (ref 70–99)
Glucose, Bld: 118 mg/dL — ABNORMAL HIGH (ref 70–99)
Glucose, Bld: 127 mg/dL — ABNORMAL HIGH (ref 70–99)
Glucose, Bld: 132 mg/dL — ABNORMAL HIGH (ref 70–99)
Glucose, Bld: 136 mg/dL — ABNORMAL HIGH (ref 70–99)
Glucose, Bld: 180 mg/dL — ABNORMAL HIGH (ref 70–99)
POTASSIUM: 4.2 meq/L (ref 3.7–5.3)
POTASSIUM: 4.3 meq/L (ref 3.7–5.3)
POTASSIUM: 4 meq/L (ref 3.7–5.3)
POTASSIUM: 3.7 meq/L (ref 3.7–5.3)
POTASSIUM: 3.7 meq/L (ref 3.7–5.3)
Potassium: 3.5 mEq/L — ABNORMAL LOW (ref 3.7–5.3)
SODIUM: 135 meq/L — AB (ref 137–147)
SODIUM: 138 meq/L (ref 137–147)
SODIUM: 138 meq/L (ref 137–147)
SODIUM: 138 meq/L (ref 137–147)
SODIUM: 139 meq/L (ref 137–147)
Sodium: 138 mEq/L (ref 137–147)

## 2014-06-29 LAB — GLUCOSE, CAPILLARY
GLUCOSE-CAPILLARY: 89 mg/dL (ref 70–99)
Glucose-Capillary: 137 mg/dL — ABNORMAL HIGH (ref 70–99)
Glucose-Capillary: 137 mg/dL — ABNORMAL HIGH (ref 70–99)
Glucose-Capillary: 147 mg/dL — ABNORMAL HIGH (ref 70–99)
Glucose-Capillary: 184 mg/dL — ABNORMAL HIGH (ref 70–99)
Glucose-Capillary: 94 mg/dL (ref 70–99)
Glucose-Capillary: 91 mg/dL (ref 70–99)
Glucose-Capillary: 103 mg/dL — ABNORMAL HIGH (ref 70–99)
Glucose-Capillary: 110 mg/dL — ABNORMAL HIGH (ref 70–99)

## 2014-06-29 LAB — CBC
HEMATOCRIT: 36.8 % — AB (ref 39.0–52.0)
Hemoglobin: 12.3 g/dL — ABNORMAL LOW (ref 13.0–17.0)
MCH: 29.6 pg (ref 26.0–34.0)
MCHC: 33.4 g/dL (ref 30.0–36.0)
MCV: 88.7 fL (ref 78.0–100.0)
Platelets: 231 10*3/uL (ref 150–400)
RBC: 4.15 MIL/uL — ABNORMAL LOW (ref 4.22–5.81)
RDW: 16 % — ABNORMAL HIGH (ref 11.5–15.5)
WBC: 15.6 10*3/uL — ABNORMAL HIGH (ref 4.0–10.5)

## 2014-06-29 LAB — POCT I-STAT 3, ART BLOOD GAS (G3+)
ACID-BASE DEFICIT: 5 mmol/L — AB (ref 0.0–2.0)
Bicarbonate: 18.7 mEq/L — ABNORMAL LOW (ref 20.0–24.0)
O2 Saturation: 99 %
PH ART: 7.428 (ref 7.350–7.450)
PO2 ART: 143 mmHg — AB (ref 80.0–100.0)
Patient temperature: 92.3
TCO2: 20 mmol/L (ref 0–100)
pCO2 arterial: 27.3 mmHg — ABNORMAL LOW (ref 35.0–45.0)

## 2014-06-29 LAB — HEPARIN LEVEL (UNFRACTIONATED)
HEPARIN UNFRACTIONATED: 0.25 [IU]/mL — AB (ref 0.30–0.70)
Heparin Unfractionated: 0.57 IU/mL (ref 0.30–0.70)
Heparin Unfractionated: 0.54 IU/mL (ref 0.30–0.70)

## 2014-06-29 LAB — TROPONIN I: TROPONIN I: 5.29 ng/mL — AB (ref <0.30)

## 2014-06-29 LAB — LACTIC ACID, PLASMA: LACTIC ACID, VENOUS: 1.8 mmol/L (ref 0.5–2.2)

## 2014-06-29 MED ORDER — NOREPINEPHRINE BITARTRATE 1 MG/ML IV SOLN
2.0000 ug/min | INTRAVENOUS | Status: DC
  Administered 2014-06-29: 20 ug/min via INTRAVENOUS
  Administered 2014-06-30: 5 ug/min via INTRAVENOUS
  Filled 2014-06-29 (×2): qty 16

## 2014-06-29 MED ORDER — SODIUM CHLORIDE 0.9 % IV BOLUS (SEPSIS)
1000.0000 mL | Freq: Once | INTRAVENOUS | Status: AC
  Administered 2014-06-29: 1000 mL via INTRAVENOUS

## 2014-06-29 MED ORDER — SODIUM CHLORIDE 0.9 % IV BOLUS (SEPSIS)
500.0000 mL | Freq: Once | INTRAVENOUS | Status: AC
  Administered 2014-06-29: 500 mL via INTRAVENOUS

## 2014-06-29 MED ORDER — POTASSIUM CHLORIDE 10 MEQ/50ML IV SOLN
10.0000 meq | INTRAVENOUS | Status: AC
  Administered 2014-06-29 (×4): 10 meq via INTRAVENOUS
  Filled 2014-06-29 (×4): qty 50

## 2014-06-29 NOTE — Progress Notes (Signed)
ANTICOAGULATION CONSULT NOTE - Follow Up Consult  Pharmacy Consult for Heparin  Indication: chest pain/ACS  No Known Allergies  Patient Measurements: Height: 5\' 9"  (175.3 cm) Weight: 150 lb (68.04 kg) IBW/kg (Calculated) : 70.7  Vital Signs: Temp: 92.3 F (33.5 C) (07/24 0400) Temp src: Core (Comment) (07/24 0400) BP: 142/51 mmHg (07/24 0317) Pulse Rate: 50 (07/24 0400)  Labs:  Recent Labs  July 24, 2014 1200 24-Jul-2014 1238  2014/07/24 1607  07-24-14 2006 07-24-2014 2016 06/29/14 06/29/14 0400  HGB 10.5*  --   < > 13.9  --   --  14.3  --  12.3*  HCT 32.3*  --   < > 41.0  --   --  42.0  --  36.8*  PLT 202  --   --   --   --   --   --   --  231  APTT  --  32  --   --   --  31  --   --   --   LABPROT  --  16.5*  --   --   --  16.5*  --   --   --   INR  --  1.33  --   --   --  1.33  --   --   --   HEPARINUNFRC  --   --   --   --   --   --   --   --  0.25*  CREATININE  --  1.16  < > 1.10  < >  --  1.10 1.00 0.99  TROPONINI 0.38*  --   --   --   --   --   --   --   --   < > = values in this interval not displayed.  Estimated Creatinine Clearance: 54.4 ml/min (by C-G formula based on Cr of 0.99).   Medications:  Heparin 650 units/hr  Assessment: 80 y/o M on [place] sun protocol, sub-therapeutic HL of 0.25, other labs as above, no issues per RN.   Goal of Therapy:  Heparin level 0.3-0.7 units/ml Monitor platelets by anticoagulation protocol: Yes   Plan:  -Increase heparin to 750 units/hr -1300 HL -Daily CBC/HL -Monitor for bleeding -F/U warming time  Abran Duke 06/29/2014,5:00 AM

## 2014-06-29 NOTE — Progress Notes (Addendum)
Cardiologist: Dr. 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 EMS prompt. DC CV 3 times. 15 min CPR. Wife started CPR.  7/24 - sedate, cooled.   Wife states that they have had 3 children die. (one foster). He has not complained of any dyspnea or chest pain recently. No syncope. Works in his garden. Cuts grass. Stoic. Had echo scheduled for next week.   Objective:  Vital Signs in the last 24 hours: Temp:  [88.7 F (31.5 C)-97.4 F (36.3 C)] 92.1 F (33.4 C) (07/24 0900) Pulse Rate:  [38-83] 50 (07/24 0900) Resp:  [15-23] 16 (07/24 0900) BP: (116-249)/(38-111) 141/50 mmHg (07/24 0803) SpO2:  [90 %-100 %] 100 % (07/24 0900) Arterial Line BP: (115-169)/(40-74) 163/58 mmHg (07/24 0900) FiO2 (%):  [40 %-100 %] 40 % (07/24 0900) Weight:  [150 lb (68.04 kg)] 150 lb (68.04 kg) (07/23 1200)  Intake/Output from previous day: 07/23 0701 - 07/24 0700 In: 3368.3 [I.V.:2098.3; NG/GT:70; IV Piggyback:1200] Out: 1960 [Urine:1860; Emesis/NG output:100]   Physical Exam: General: Well developed, sedate cooling on Vent. Head:  Normocephalic and atraumatic. ETT Lungs: Clear to auscultation and percussion. Vent noise Heart: Normal S1 and S2.  3/6 systolic murmur RUSB, norubs or gallops.  Abdomen: soft, non-tender, positive bowel sounds. Extremities: No clubbing or cyanosis. No edema. Neurologic: Sedate.    Lab Results:  Recent Labs  06/14/2014 1200  06/27/2014 2016 06/29/14 0400  WBC 17.3*  --   --  15.6*  HGB 10.5*  < > 14.3 12.3*  PLT 202  --   --  231  < > = values in this interval not displayed.  Recent Labs  06/29/14 0400 06/29/14 0800  NA 135* 138  K 4.2 4.3  CL 104 104  CO2 17* 18*  GLUCOSE 136* 132*  BUN 20 20  CREATININE 0.99 1.02    Recent Labs  06/09/2014 1200  TROPONINI 0.38*   Hepatic Function Panel  Recent Labs  06/15/2014 1238  PROT 6.0  ALBUMIN 2.8*  AST 175*  ALT 150*  ALKPHOS 83  BILITOT 0.8    Imaging: Ct  Head Wo Contrast  07/05/2014   CLINICAL DATA:  Cardiac arrest  EXAM: CT HEAD WITHOUT CONTRAST  TECHNIQUE: Contiguous axial images were obtained from the base of the skull through the vertex without intravenous contrast.  COMPARISON:  None.  FINDINGS: Generalized atrophy. Negative for acute infarct. Negative for hemorrhage or mass. Calvarium intact.  IMPRESSION: No acute abnormality.   Electronically Signed   By: Marlan Palau M.D.   On: 07/05/2014 13:20   Dg Chest Port 1 View  06/27/2014   CLINICAL DATA:  Status post line insertion.  EXAM: PORTABLE CHEST - 1 VIEW  COMPARISON:  Same day.  FINDINGS: Stable cardiomediastinal silhouette. Endotracheal and nasogastric tubes are unchanged in position. No pneumothorax or pleural effusion is noted. Mild bilateral perihilar interstitial densities are noted concerning for edema or central pulmonary vascular congestion. Interval placement of left internal jugular catheter line with distal tip in the expected position of the SVC. Bony thorax is intact.  IMPRESSION: Interval placement of left internal jugular catheter line with distal tip in expected position of SVC. No pneumothorax is noted. Mild bilateral perihilar interstitial densities are noted concerning for edema or pulmonary vascular congestion.   Electronically Signed   By: Roque Lias M.D.   On: 06/12/2014 15:38   Dg Chest Port 1 View  06/08/2014   CLINICAL DATA:  Cardiac arrest.  Endotracheal tube placement.  EXAM: PORTABLE CHEST - 1 VIEW  COMPARISON:  03/01/2014.  FINDINGS: Support apparatus: Endotracheal tube tip is 9 cm from the carina, just inferior to the thoracic inlet. Enteric tube is present with the tip not visualized. Defibrillator pads overlie the chest. Monitoring leads project over the chest.  Cardiomediastinal Silhouette:  Normal.  Lungs: Clear.  No pneumothorax.  Effusions: None visible. Extreme costophrenic angles excluded from view.  Other:  Bilateral AC joint osteoarthritis.  IMPRESSION: 1.  Support apparatus as described above. The endotracheal tube could be advanced 2 cm for more secure positioning. 2. No active cardiopulmonary disease.   Electronically Signed   By: Andreas NewportGeoffrey  Lamke M.D.   On: 07/05/2014 12:24   Personally viewed.   Telemetry:  Sinus brady with PAC. Personally viewed.   EKG:  NSR, PAC, RBBB, ST depression laterally  Cardiac Studies:  ECHO - - Inferior and inferolateral akinesis; remaining walls hypokinetic;overall EF 30-35; mildly reduced RV function; mild biatrial enlargement; severely calcified aortic valve with reduced cusp excursion; severe AS (valve area 0.6 cm2 by continuity equation; dimensionless index 0.17); mild AI; mild MR; probable grade 3 diastolic dysfunction.    Marland Kitchen. antiseptic oral rinse  15 mL Mouth Rinse QID  . artificial tears  1 application Both Eyes 3 times per day  . aspirin  81 mg Oral Daily  . chlorhexidine  15 mL Mouth Rinse BID  . insulin aspart  0-9 Units Subcutaneous 6 times per day  . pantoprazole (PROTONIX) IV  40 mg Intravenous Q24H  . sodium chloride  3 mL Intravenous Q12H   Assessment/Plan:  Active Problems:   Cardiac arrest   Ventricular fibrillation   NSTEMI (non-ST elevated myocardial infarction)   Aortic stenosis  80 year old with VFIB arrest 06/19/2014. Severe 3v CAD, EF 30%  1. Cardiac arrest - VFIB. Cath performed. Severe multivessel coronary artery disease with severe stenosis of the LAD, and critical stenosis of the left circumflex which is a dominant vessel. Extensive collaterals. No PCI performed. Checking delta Troponin.    2. Severe aortic stenosis - if improves, ? CABG with AVR/ TAVR. He obviously he much ground to cover prior to contemplation of further treatment strategy.   3. Hypothermia - per CCM.   Lengthy discussion. Understands critical condition.       Ikeem Cleckler 06/29/2014, 10:14 AM

## 2014-06-29 NOTE — Procedures (Signed)
Supervised and present through the entire procedure.  Tressia Labrum, MD Pulmonary and Critical Care Medicine [facility] Pager: [phone]  

## 2014-06-29 NOTE — Progress Notes (Signed)
Pt switched over to rewarming mode via the [employer], witnessed x2 RN, H. Mariadel Mruk and Paul Half.

## 2014-06-29 NOTE — H&P (Signed)
PULMONARY / CRITICAL CARE MEDICINE   Name: Bryan Long MRN: [id number] DOB: 1928-12-21    ADMISSION DATE:  07/25/14  REFERRING MD :  EDP   CHIEF COMPLAINT:  Cardiac arrest  INITIAL PRESENTATION: 80 yo admiited 7/23 after VF arrest at home.  Approximate 15 minute downtime. Intubated in field, required shock x 3 + ACLS.  STUDIES / EVENTS:  7/23  CT head >>> neg 7/23  Cardiac Cath >>> severe multivessel CAD 7/23  Hypothermia started 7/24  TTE EF 30-35%  LINES: OETT  7/23 >>> OGT 7/23 >>> L IJ CVL 7/23 >>> R R AL 7/23 >>> Foley 7/23 >>>  CULTURES:  ANTIBIOTICS:  INTERVAL HISTORY: No acute events overnight  VITAL SIGNS: Temp:  [88.7 F (31.5 C)-97.4 F (36.3 C)] 92.1 F (33.4 C) (07/24 0900) Pulse Rate:  [38-83] 50 (07/24 0900) Resp:  [15-23] 16 (07/24 0900) BP: (116-249)/(38-111) 141/50 mmHg (07/24 0803) SpO2:  [90 %-100 %] 100 % (07/24 0900) Arterial Line BP: (115-169)/(40-74) 163/58 mmHg (07/24 0900) FiO2 (%):  [40 %-100 %] 40 % (07/24 0900) Weight:  [68.04 kg (150 lb)] 68.04 kg (150 lb) (07/23 1200)  HEMODYNAMICS: CVP:  [1 mmHg-8 mmHg] 6 mmHg  VENTILATOR SETTINGS: Vent Mode:  [-] PRVC FiO2 (%):  [40 %-100 %] 40 % Set Rate:  [16 bmp] 16 bmp Vt Set:  [560 mL-580 mL] 580 mL PEEP:  [5 cmH20] 5 cmH20 Plateau Pressure:  [13 cmH20-20 cmH20] 19 cmH20  INTAKE / OUTPUT: Intake/Output     07/23 0701 - 07/24 0700 07/24 0701 - 07/25 0700   I.V. (mL/kg) 2098.3 (30.8) 283.2 (4.2)   NG/GT 70 40   IV Piggyback 1200    Total Intake(mL/kg) 3368.3 (49.5) 323.2 (4.8)   Urine (mL/kg/hr) 1860 10 (0)   Emesis/NG output 100    Total Output 1960 10   Net +1408.3 +313.2          PHYSICAL EXAMINATION: General:  Appears acutely ill, mechanically ventilated, synchronous Neuro:  Encephalopathic, nonfocal, cough / gag absent HEENT:  PERRL, OETT / OGT, c-collar in place Cardiovascular:  RRR, no m/r/g Lungs:  Bilateral diminished air entry, no w/r/r Abdomen:  Soft,  nontender, bowel sounds diminished Musculoskeletal:  No edema Skin:  Intact, mottled LE   LABS:  CBC  Recent Labs Lab 07/25/2014 1200  Jul 25, 2014 1607 07-25-2014 2016 06/29/14 0400  WBC 17.3*  --   --   --  15.6*  HGB 10.5*  < > 13.9 14.3 12.3*  HCT 32.3*  < > 41.0 42.0 36.8*  PLT 202  --   --   --  231  < > = values in this interval not displayed. Coag's  Recent Labs Lab 07-25-14 1238 Jul 25, 2014 2006  APTT 32 31  INR 1.33 1.33   BMET  Recent Labs Lab 06/29/14 06/29/14 0400 06/29/14 0800  NA 138 135* 138  K 3.5* 4.2 4.3  CL 105 104 104  CO2 17* 17* 18*  BUN 20 20 20   CREATININE 1.00 0.99 1.02  GLUCOSE 180* 136* 132*   Electrolytes  Recent Labs Lab 06/29/14 06/29/14 0400 06/29/14 0800  CALCIUM 7.3* 7.2* 7.2*   Sepsis Markers  Recent Labs Lab 2014-07-25 1238  LATICACIDVEN 5.6*   ABG  Recent Labs Lab 2014/07/25 1325 06/29/14 0817  PHART 7.343* 7.428  PCO2ART 38.6 27.3*  PO2ART 519.0* 143.0*   Liver Enzymes  Recent Labs Lab 2014-07-25 1238  AST 175*  ALT 150*  ALKPHOS 83  BILITOT 0.8  ALBUMIN 2.8*   Cardiac Enzymes  Recent Labs Lab 07/03/2014 1200  TROPONINI 0.38*  PROBNP 4283.0*   Glucose  Recent Labs Lab 06/15/2014 2159 06/29/14 0002 06/29/14 0245 06/29/14 0403 06/29/14 0604 06/29/14 0740  GLUCAP 207* 184* 147* 137* 137* 94   IMAGING:   Ct Head Wo Contrast  06/25/2014   CLINICAL DATA:  Cardiac arrest  EXAM: CT HEAD WITHOUT CONTRAST  TECHNIQUE: Contiguous axial images were obtained from the base of the skull through the vertex without intravenous contrast.  COMPARISON:  None.  FINDINGS: Generalized atrophy. Negative for acute infarct. Negative for hemorrhage or mass. Calvarium intact.  IMPRESSION: No acute abnormality.   Electronically Signed   By: Marlan Palauharles  Clark M.D.   On: 06/21/2014 13:20   Dg Chest Port 1 View  06/22/2014   CLINICAL DATA:  Status post line insertion.  EXAM: PORTABLE CHEST - 1 VIEW  COMPARISON:  Same day.  FINDINGS:  Stable cardiomediastinal silhouette. Endotracheal and nasogastric tubes are unchanged in position. No pneumothorax or pleural effusion is noted. Mild bilateral perihilar interstitial densities are noted concerning for edema or central pulmonary vascular congestion. Interval placement of left internal jugular catheter line with distal tip in the expected position of the SVC. Bony thorax is intact.  IMPRESSION: Interval placement of left internal jugular catheter line with distal tip in expected position of SVC. No pneumothorax is noted. Mild bilateral perihilar interstitial densities are noted concerning for edema or pulmonary vascular congestion.   Electronically Signed   By: Roque LiasJames  Green M.D.   On: 06/16/2014 15:38   Dg Chest Port 1 View  06/07/2014   CLINICAL DATA:  Cardiac arrest.  Endotracheal tube placement.  EXAM: PORTABLE CHEST - 1 VIEW  COMPARISON:  03/01/2014.  FINDINGS: Support apparatus: Endotracheal tube tip is 9 cm from the carina, just inferior to the thoracic inlet. Enteric tube is present with the tip not visualized. Defibrillator pads overlie the chest. Monitoring leads project over the chest.  Cardiomediastinal Silhouette:  Normal.  Lungs: Clear.  No pneumothorax.  Effusions: None visible. Extreme costophrenic angles excluded from view.  Other:  Bilateral AC joint osteoarthritis.  IMPRESSION: 1. Support apparatus as described above. The endotracheal tube could be advanced 2 cm for more secure positioning. 2. No active cardiopulmonary disease.   Electronically Signed   By: Andreas NewportGeoffrey  Lamke M.D.   On: 06/15/2014 12:24   ASSESSMENT / PLAN:  PULMONARY A: Acute respiratory failure in setting of cardiac arrest Possible aspiration pneumonia P:  Goal pH>7.30, SpO2>92 Continuous mechanical support VAP bundle Defer SBT as on Nimbex Trend ABG/CXR Albuterol PRN  CARDIOVASCULAR A:  NSTEMI Severe multivessel CAD VF arrest Acute on chronic systolic / diastolic heart failure Cardiogenic  shock P:  MAP>85 Levophed gtt Amiodarone gtt Trend troponin / lactate ASA BB / ACEI contraindicated - shock Statin contraindicated - elevated transaminases  RENAL A:   At risk for AKI  Metabolic acidosis, mild P:   Trend BMP CVP IVF per Hypothermia protocol  GASTROINTESTINAL A:   Nutrition GI Px Shock liver P:   NPO Protonix Consider TF if not extubated after rewarming Trend LFT  HEMATOLOGIC A:   Anemia Heparinization for NSTEMI P:  Trend CBC Heparin gtt  INFECTIOUS A:   Reactive leucocytosis P:   Monitor off abx  ENDOCRINE A:   Hyperglycemia    P:   SSI   NEUROLOGIC A:   Acute encephalopathy Possible anoxia P:   Hypothermia protocol RASS goal -3 to -4  Fentanyl /  Versed / Nimbex Defer WUA  I have personally obtained history, examined patient, evaluated and interpreted laboratory and imaging results, reviewed medical records, formulated assessment / plan and placed orders.  CRITICAL CARE:  The patient is critically ill with multiple organ systems failure and requires high complexity decision making for assessment and support, frequent evaluation and titration of therapies, application of advanced monitoring technologies and extensive interpretation of multiple databases. Critical Care Time devoted to patient care services described in this note is 35 minutes.   Lonia Farber, MD Pulmonary and Critical Care Medicine [facility] Pager: [phone]  06/29/2014, 10:16 AM

## 2014-06-29 NOTE — Progress Notes (Signed)
ANTICOAGULATION CONSULT NOTE - Follow Up Consult  Pharmacy Consult for Heparin  Indication: chest pain/ACS  No Known Allergies  Patient Measurements: Height: 5\' 9"  (175.3 cm) Weight: 150 lb (68.04 kg) IBW/kg (Calculated) : 70.7  Vital Signs: Temp: 92.7 F (33.7 C) (07/24 1300) Temp src: Core (Comment) (07/24 1300) BP: 135/51 mmHg (07/24 1203) Pulse Rate: 60 (07/24 1300)  Labs:  Recent Labs  06/20/2014 1200 06/12/2014 1238  06/29/2014 1607  06/06/2014 2006 07/05/2014 2016  06/29/14 0400 06/29/14 0800 06/29/14 1156 06/29/14 1200 06/29/14 1320  HGB 10.5*  --   < > 13.9  --   --  14.3  --  12.3*  --   --   --   --   HCT 32.3*  --   < > 41.0  --   --  42.0  --  36.8*  --   --   --   --   PLT 202  --   --   --   --   --   --   --  231  --   --   --   --   APTT  --  32  --   --   --  31  --   --   --   --   --   --   --   LABPROT  --  16.5*  --   --   --  16.5*  --   --   --   --   --   --   --   INR  --  1.33  --   --   --  1.33  --   --   --   --   --   --   --   HEPARINUNFRC  --   --   --   --   --   --   --   --  0.25*  --   --   --  0.57  CREATININE  --  1.16  < > 1.10  < >  --  1.10  < > 0.99 1.02 1.05  --   --   TROPONINI 0.38*  --   --   --   --   --   --   --   --   --   --  5.29*  --   < > = values in this interval not displayed.  Estimated Creatinine Clearance: 51.3 ml/min (by C-G formula based on Cr of 1.05).   Medications:  Heparin 750 units/hr  Assessment: 80 yo male w/p VF arrest and NSTEMI now s/p cath (with severe multivessel CAD) on heparin and now at goal at 750 units/hr. Patient noted on hypothermia protocol to begin rewarming at 3:34pm today   Goal of Therapy:  Heparin level 0.3-0.7 units/ml Monitor platelets by anticoagulation protocol: Yes   Plan:  -No heparin changes needed -Will check a heparin level later tonight due to rewarming -Daily heparin level and CBC  [name], Pharm D 06/29/2014 2:03 PM

## 2014-06-29 NOTE — Progress Notes (Signed)
eLink Physician-Brief Progress Note Patient Name: Bryan Long DOB: 1929/10/19 MRN: [id number]  Date of Service  06/29/2014   HPI/Events of Note  CVP of 1 on pressors with MAP of 88 Hypokalemia   eICU Interventions  500 cc NS fluid bolus Replace potassium   Intervention Category Intermediate Interventions: Hypotension - evaluation and management;Electrolyte abnormality - evaluation and management  Kenya Shiraishi 06/29/2014, 12:46 AM

## 2014-06-29 NOTE — Progress Notes (Signed)
INITIAL NUTRITION ASSESSMENT  DOCUMENTATION CODES Per approved criteria  -Not Applicable   INTERVENTION:  Once patient rewarmed & TF warranted, recommend Vital AF 1.2 formula -- initiate at 20 ml/hr and increase by 10 ml every 4 hours to goal rate of 60 ml/hr to provide 1728 kcals, 108 gm protein, 1168 ml of free water RD to follow for nutrition care plan  NUTRITION DIAGNOSIS: Inadequate oral intake related to inability to eat as evidenced by NPO status  Goal: Initiation of nutrition support in next 24-48 hours if prolonged intubation expected  Monitor:  TF initiation & tolerance, respiratory status, weight, labs, I/O's  Reason for Assessment: VDRF  80 y.o. male  Admitting Dx: cardiac arrest  ASSESSMENT: 80 yo admiited 7/23 after VF arrest at home. Approximate 15 minute downtime. Intubated in field, required shock x 3 + ACLS.  Patient is currently intubated on ventilator support -- OGT in place MV: 9.5 L/min Temp (24hrs), Avg:92.5 F (33.6 C), Min:88.7 F (31.5 C), Max:97.4 F (36.3 C)   Patient currently on the Hypothermia Protocol.  Per Malnutrition Screening Tool Report, pt has had recent weight loss without trying.  Amount not documented.  RD unable to complete Nutrition Focused Physical Exam at this time.  Height: Ht Readings from Last 1 Encounters:  06/17/2014 5\' 9"  (1.753 m)    Weight: Wt Readings from Last 1 Encounters:  July 06, 2014 150 lb (68.04 kg)    Ideal Body Weight: 160 lb  % Ideal Body Weight: 94%  Wt Readings from Last 10 Encounters:  July 06, 2014 150 lb (68.04 kg)  06-Jul-2014 150 lb (68.04 kg)    Usual Body Weight: unable to obtain  % Usual Body Weight: ---  BMI:  Body mass index is 22.14 kg/(m^2).  Estimated Nutritional Needs (using 37 degrees C): Kcal: 1600-1750 Protein: 105-115 gm Fluid: per MD  Skin: Intact  Diet Order: NPO  EDUCATION NEEDS: -No education needs identified at this time   Intake/Output Summary (Last 24 hours) at  06/29/14 0857 Last data filed at 06/29/14 0800  Gross per 24 hour  Intake 3509.94 ml  Output   1970 ml  Net 1539.94 ml    Labs:   Recent Labs Lab 06/29/14 06/29/14 0400 06/29/14 0800  NA 138 135* 138  K 3.5* 4.2 4.3  CL 105 104 104  CO2 17* 17* 18*  BUN 20 20 20   CREATININE 1.00 0.99 1.02  CALCIUM 7.3* 7.2* 7.2*  GLUCOSE 180* 136* 132*    CBG (last 3)   Recent Labs  06/29/14 0403 06/29/14 0604 06/29/14 0740  GLUCAP 137* 137* 94    Scheduled Meds: . antiseptic oral rinse  15 mL Mouth Rinse QID  . artificial tears  1 application Both Eyes 3 times per day  . aspirin  81 mg Oral Daily  . chlorhexidine  15 mL Mouth Rinse BID  . insulin aspart  0-9 Units Subcutaneous 6 times per day  . pantoprazole (PROTONIX) IV  40 mg Intravenous Q24H  . sodium chloride  3 mL Intravenous Q12H    Continuous Infusions: . sodium chloride 10 mL/hr at 06/13/2014 1900  . sodium chloride 5 mL/hr at 06-Jul-2014 2146  . sodium chloride 10 mL/hr at 06/25/2014 1900  . sodium chloride 10 mL/hr at 06/29/14 0600  . amiodarone 30 mg/hr (06/29/14 0240)  . cisatracurium (NIMBEX) infusion 1 mcg/kg/min (06/20/2014 1900)  . fentaNYL infusion INTRAVENOUS 200 mcg/hr (06/29/14 0120)  . heparin 750 Units/hr (06/29/14 0700)  . midazolam (VERSED) infusion 2 mg/hr (06/12/2014  2317)  . nitroGLYCERIN 5 mcg/min (2014-09-08 1700)  . norepinephrine (LEVOPHED) Adult infusion 15 mcg/min (06/29/14 [id number])    Past Medical History  Diagnosis Date  . Hyperlipidemia   . Peripheral vascular disease     Past Surgical History  Procedure Laterality Date  . Carotid endarterectomy      Maureen ChattersKatie Vitoria Conyer, RD, LDN Pager #: [phone] After-Hours Pager #: [phone]

## 2014-06-29 NOTE — Progress Notes (Signed)
ANTICOAGULATION CONSULT NOTE - Follow Up Consult  Pharmacy Consult for Heparin  Indication: chest pain/ACS  No Known Allergies  Patient Measurements: Height: 5\' 9"  (175.3 cm) Weight: 150 lb (68.04 kg) IBW/kg (Calculated) : 70.7  Vital Signs: Temp: 93 F (33.9 C) (07/24 2000) Temp src: Core (Comment) (07/24 2000) BP: 150/51 mmHg (07/24 1940) Pulse Rate: 59 (07/24 2000)  Labs:  Recent Labs  06/18/2014 1200 06/12/2014 1238  07/05/2014 1607  06/27/2014 2006 06/08/2014 2016  06/29/14 0400 06/29/14 0800 06/29/14 1156 06/29/14 1200 06/29/14 1320 06/29/14 1600 06/29/14 2000  HGB 10.5*  --   < > 13.9  --   --  14.3  --  12.3*  --   --   --   --   --   --   HCT 32.3*  --   < > 41.0  --   --  42.0  --  36.8*  --   --   --   --   --   --   PLT 202  --   --   --   --   --   --   --  231  --   --   --   --   --   --   APTT  --  32  --   --   --  31  --   --   --   --   --   --   --   --   --   LABPROT  --  16.5*  --   --   --  16.5*  --   --   --   --   --   --   --   --   --   INR  --  1.33  --   --   --  1.33  --   --   --   --   --   --   --   --   --   HEPARINUNFRC  --   --   --   --   --   --   --   --  0.25*  --   --   --  0.57  --  0.54  CREATININE  --  1.16  < > 1.10  < >  --  1.10  < > 0.99 1.02 1.05  --   --  0.98  --   TROPONINI 0.38*  --   --   --   --   --   --   --   --   --   --  5.29*  --   --   --   < > = values in this interval not displayed.  Estimated Creatinine Clearance: 54.9 ml/min (by C-G formula based on Cr of 0.98).   Medications:  Heparin at 750 units/hr  Assessment: 80 yo male with VF arrest and NSTEMI now s/p cath (with severe multivessel CAD) on heparin. Patient noted on hypothermia protocol and began rewarming at 16:00 today.  Heparin level is therapeutic at 0.54 on 750 units/hr.  Goal of Therapy:  Heparin level 0.3-0.7 units/ml Monitor platelets by anticoagulation protocol: Yes   Plan:  -No heparin changes needed -Will check a heparin level later  tonight due to rewarming -Daily heparin level and CBC  [facility], [place].D., BCPS Clinical Pharmacist Pager: [phone] 06/29/2014 8:39 PM

## 2014-06-29 NOTE — Progress Notes (Signed)
  Echocardiogram 2D Echocardiogram has been performed.  [place] 06/29/2014, 8:20 AM

## 2014-06-30 LAB — BASIC METABOLIC PANEL
Anion gap: 13 (ref 5–15)
Anion gap: 16 — ABNORMAL HIGH (ref 5–15)
BUN: 19 mg/dL (ref 6–23)
BUN: 19 mg/dL (ref 6–23)
CALCIUM: 7 mg/dL — AB (ref 8.4–10.5)
CALCIUM: 7.1 mg/dL — AB (ref 8.4–10.5)
CO2: 17 mEq/L — ABNORMAL LOW (ref 19–32)
CO2: 16 meq/L — AB (ref 19–32)
CREATININE: 1.19 mg/dL (ref 0.50–1.35)
CREATININE: 1.33 mg/dL (ref 0.50–1.35)
Chloride: 105 mEq/L (ref 96–112)
Chloride: 108 mEq/L (ref 96–112)
GFR calc Af Amer: 63 mL/min — ABNORMAL LOW (ref >90)
GFR calc non Af Amer: 48 mL/min — ABNORMAL LOW (ref >90)
GFR calc non Af Amer: 55 mL/min — ABNORMAL LOW (ref >90)
GFR, EST AFRICAN AMERICAN: 55 mL/min — AB (ref >90)
Glucose, Bld: 102 mg/dL — ABNORMAL HIGH (ref 70–99)
Glucose, Bld: 96 mg/dL (ref 70–99)
Potassium: 3.9 mEq/L (ref 3.7–5.3)
Potassium: 4.1 mEq/L (ref 3.7–5.3)
Sodium: 137 mEq/L (ref 137–147)
Sodium: 138 mEq/L (ref 137–147)

## 2014-06-30 LAB — GLUCOSE, CAPILLARY
GLUCOSE-CAPILLARY: 97 mg/dL (ref 70–99)
GLUCOSE-CAPILLARY: 59 mg/dL — AB (ref 70–99)
Glucose-Capillary: 53 mg/dL — ABNORMAL LOW (ref 70–99)
Glucose-Capillary: 54 mg/dL — ABNORMAL LOW (ref 70–99)
Glucose-Capillary: 55 mg/dL — ABNORMAL LOW (ref 70–99)
Glucose-Capillary: 56 mg/dL — ABNORMAL LOW (ref 70–99)
Glucose-Capillary: 65 mg/dL — ABNORMAL LOW (ref 70–99)
Glucose-Capillary: 75 mg/dL (ref 70–99)
Glucose-Capillary: 78 mg/dL (ref 70–99)
Glucose-Capillary: 82 mg/dL (ref 70–99)
Glucose-Capillary: 86 mg/dL (ref 70–99)
Glucose-Capillary: 91 mg/dL (ref 70–99)
Glucose-Capillary: 91 mg/dL (ref 70–99)

## 2014-06-30 LAB — CBC
HEMATOCRIT: 35.4 % — AB (ref 39.0–52.0)
HEMOGLOBIN: 12.2 g/dL — AB (ref 13.0–17.0)
MCH: 30.3 pg (ref 26.0–34.0)
MCHC: 34.5 g/dL (ref 30.0–36.0)
MCV: 88.1 fL (ref 78.0–100.0)
Platelets: 224 10*3/uL (ref 150–400)
RBC: 4.02 MIL/uL — ABNORMAL LOW (ref 4.22–5.81)
RDW: 16.4 % — ABNORMAL HIGH (ref 11.5–15.5)
WBC: 15.1 10*3/uL — ABNORMAL HIGH (ref 4.0–10.5)

## 2014-06-30 LAB — HEPARIN LEVEL (UNFRACTIONATED)
Heparin Unfractionated: 0.4 IU/mL (ref 0.30–0.70)
Heparin Unfractionated: 0.46 IU/mL (ref 0.30–0.70)

## 2014-06-30 LAB — TRIGLYCERIDES: TRIGLYCERIDES: 78 mg/dL (ref <150)

## 2014-06-30 MED ORDER — FENTANYL CITRATE 0.05 MG/ML IJ SOLN
25.0000 ug | INTRAMUSCULAR | Status: DC | PRN
  Administered 2014-06-30: 25 ug via INTRAVENOUS
  Administered 2014-07-01: 50 ug via INTRAVENOUS
  Administered 2014-07-01 – 2014-07-02 (×3): 25 ug via INTRAVENOUS
  Administered 2014-07-04 (×2): 50 ug via INTRAVENOUS
  Filled 2014-06-30 (×7): qty 2

## 2014-06-30 MED ORDER — PANTOPRAZOLE SODIUM 40 MG PO PACK
40.0000 mg | PACK | Freq: Every day | ORAL | Status: DC
  Administered 2014-06-30 – 2014-07-03 (×4): 40 mg
  Filled 2014-06-30 (×5): qty 20

## 2014-06-30 MED ORDER — FREE WATER
200.0000 mL | Freq: Three times a day (TID) | Status: DC
  Administered 2014-06-30 – 2014-07-04 (×12): 200 mL

## 2014-06-30 MED ORDER — ASPIRIN 81 MG PO CHEW
81.0000 mg | CHEWABLE_TABLET | Freq: Every day | ORAL | Status: DC
  Administered 2014-07-01 – 2014-07-04 (×4): 81 mg
  Filled 2014-06-30 (×4): qty 1

## 2014-06-30 MED ORDER — VITAL AF 1.2 CAL PO LIQD
1000.0000 mL | ORAL | Status: DC
  Administered 2014-06-30 – 2014-07-04 (×4): 1000 mL
  Filled 2014-06-30 (×7): qty 1000

## 2014-06-30 MED ORDER — SODIUM CHLORIDE 0.9 % IV BOLUS (SEPSIS)
750.0000 mL | Freq: Once | INTRAVENOUS | Status: AC
Start: 2014-06-30 — End: 2014-06-30
  Administered 2014-06-30: 750 mL via INTRAVENOUS

## 2014-06-30 MED ORDER — DEXTROSE-NACL 5-0.45 % IV SOLN
INTRAVENOUS | Status: DC
  Administered 2014-06-30 – 2014-07-01 (×2): via INTRAVENOUS

## 2014-06-30 MED ORDER — DEXTROSE 50 % IV SOLN
25.0000 mL | Freq: Once | INTRAVENOUS | Status: AC | PRN
  Administered 2014-06-30: 25 mL via INTRAVENOUS

## 2014-06-30 MED ORDER — VITAL HIGH PROTEIN PO LIQD
1000.0000 mL | ORAL | Status: DC
  Filled 2014-06-30 (×2): qty 1000

## 2014-06-30 MED ORDER — PROPOFOL 10 MG/ML IV EMUL
0.0000 ug/kg/min | INTRAVENOUS | Status: DC
  Administered 2014-06-30: 5 ug/kg/min via INTRAVENOUS
  Administered 2014-06-30: 15 ug/kg/min via INTRAVENOUS
  Administered 2014-07-01: 25 ug/kg/min via INTRAVENOUS
  Administered 2014-07-01: 10 ug/kg/min via INTRAVENOUS
  Administered 2014-07-01: 20 ug/kg/min via INTRAVENOUS
  Administered 2014-07-02: 30 ug/kg/min via INTRAVENOUS
  Administered 2014-07-02 – 2014-07-03 (×3): 60 ug/kg/min via INTRAVENOUS
  Administered 2014-07-03: 25 ug/kg/min via INTRAVENOUS
  Administered 2014-07-03: 50 ug/kg/min via INTRAVENOUS
  Administered 2014-07-04 (×2): 35 ug/kg/min via INTRAVENOUS
  Filled 2014-06-30 (×16): qty 100

## 2014-06-30 MED ORDER — DEXTROSE 50 % IV SOLN
25.0000 mL | Freq: Once | INTRAVENOUS | Status: AC | PRN
  Filled 2014-06-30: qty 50

## 2014-06-30 MED ORDER — DEXTROSE 50 % IV SOLN
INTRAVENOUS | Status: AC
  Filled 2014-06-30: qty 50

## 2014-06-30 MED ORDER — DEXTROSE 50 % IV SOLN
25.0000 mL | Freq: Once | INTRAVENOUS | Status: AC
  Administered 2014-06-30: 25 mL via INTRAVENOUS

## 2014-06-30 MED ORDER — DEXTROSE 50 % IV SOLN
INTRAVENOUS | Status: AC
  Administered 2014-06-30: 25 mL via INTRAVENOUS
  Filled 2014-06-30: qty 50

## 2014-06-30 MED ORDER — FENTANYL CITRATE 0.05 MG/ML IJ SOLN: 50.0000 ug | INTRAMUSCULAR | Status: DC | PRN

## 2014-06-30 NOTE — Progress Notes (Signed)
Changed vent mode to PSV 10/5 40%  Patient tolerated well SATS 96% RR 8, TV 788 ml, HR 76 will continue to monitor patient.

## 2014-06-30 NOTE — Progress Notes (Signed)
Pt's temperature noted to be rising quickly. Arctic sun parameters verified with another Charity fundraiser. Connections checked. Water added. Will continue to monitor

## 2014-06-30 NOTE — Progress Notes (Addendum)
Cardiologist: Dr. 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 EMS prompt. DC CV 3 times. 15 min CPR. Wife started CPR.  7/24 - sedate, cooled.  Intubated and sedated this AM. Objective:  Vital Signs in the last 24 hours: Temp:  [90 F (32.2 C)-99.5 F (37.5 C)] 96.8 F (36 C) (07/25 0800) Pulse Rate:  [47-94] 79 (07/25 0900) Resp:  [0-21] 17 (07/25 0900) BP: (118-163)/(48-55) 150/50 mmHg (07/25 0319) SpO2:  [95 %-100 %] 95 % (07/25 0900) Arterial Line BP: (102-178)/(41-64) 102/42 mmHg (07/25 0900) FiO2 (%):  [40 %] 40 % (07/25 0900)  Intake/Output from previous day: 07/24 0701 - 07/25 0700 In: 4004.2 [I.V.:2964.2; NG/GT:40; IV Piggyback:1000] Out: 8938 [BOFBP:1025; Emesis/NG output:760]   Physical Exam: General: Well developed, sedated, intubated Head:  Normal. ETT Lungs: Mild diffuse rhonchi Heart: Normal S1 and S2.  3/6 systolic murmur RUSB Abdomen: soft, non-tender, Not distended Extremities: No edema. Neurologic: Sedated.    Lab Results:  Recent Labs  06/29/14 0400 06/30/14 0400  WBC 15.6* 15.1*  HGB 12.3* 12.2*  PLT 231 224    Recent Labs  06/30/14 06/30/14 0400  NA 137 138  K 3.9 4.1  CL 108 105  CO2 16* 17*  GLUCOSE 96 102*  BUN 19 19  CREATININE 1.19 1.33    Recent Labs  06/09/2014 1200 06/29/14 1200  TROPONINI 0.38* 5.29*   Hepatic Function Panel  Recent Labs  06/09/2014 1238  PROT 6.0  ALBUMIN 2.8*  AST 175*  ALT 150*  ALKPHOS 83  BILITOT 0.8    Imaging: Ct Head Wo Contrast  06/09/2014   CLINICAL DATA:  Cardiac arrest  EXAM: CT HEAD WITHOUT CONTRAST  TECHNIQUE: Contiguous axial images were obtained from the base of the skull through the vertex without intravenous contrast.  COMPARISON:  None.  FINDINGS: Generalized atrophy. Negative for acute infarct. Negative for hemorrhage or mass. Calvarium intact.  IMPRESSION: No acute abnormality.   Electronically Signed   By: Marlan Palau M.D.   On:  06/27/2014 13:20   Dg Chest Port 1 View  06/24/2014   CLINICAL DATA:  Status post line insertion.  EXAM: PORTABLE CHEST - 1 VIEW  COMPARISON:  Same day.  FINDINGS: Stable cardiomediastinal silhouette. Endotracheal and nasogastric tubes are unchanged in position. No pneumothorax or pleural effusion is noted. Mild bilateral perihilar interstitial densities are noted concerning for edema or central pulmonary vascular congestion. Interval placement of left internal jugular catheter line with distal tip in the expected position of the SVC. Bony thorax is intact.  IMPRESSION: Interval placement of left internal jugular catheter line with distal tip in expected position of SVC. No pneumothorax is noted. Mild bilateral perihilar interstitial densities are noted concerning for edema or pulmonary vascular congestion.   Electronically Signed   By: Roque Lias M.D.   On: 06/25/2014 15:38   Dg Chest Port 1 View  06/09/2014   CLINICAL DATA:  Cardiac arrest.  Endotracheal tube placement.  EXAM: PORTABLE CHEST - 1 VIEW  COMPARISON:  03/01/2014.  FINDINGS: Support apparatus: Endotracheal tube tip is 9 cm from the carina, just inferior to the thoracic inlet. Enteric tube is present with the tip not visualized. Defibrillator pads overlie the chest. Monitoring leads project over the chest.  Cardiomediastinal Silhouette:  Normal.  Lungs: Clear.  No pneumothorax.  Effusions: None visible. Extreme costophrenic angles excluded from view.  Other:  Bilateral AC joint osteoarthritis.  IMPRESSION: 1. Support apparatus as described above.  The endotracheal tube could be advanced 2 cm for more secure positioning. 2. No active cardiopulmonary disease.   Electronically Signed   By: Andreas NewportGeoffrey  Lamke M.D.   On: 06/19/2014 12:24     Telemetry:  Sinus    Cardiac Studies:  ECHO - - Inferior and inferolateral akinesis; remaining walls hypokinetic;overall EF 30-35; mildly reduced RV function; mild biatrial enlargement; severely calcified  aortic valve with reduced cusp excursion; severe AS (valve area 0.6 cm2 by continuity equation; dimensionless index 0.17); mild AI; mild MR; probable grade 3 diastolic dysfunction.    Marland Kitchen. antiseptic oral rinse  15 mL Mouth Rinse QID  . aspirin  81 mg Oral Daily  . chlorhexidine  15 mL Mouth Rinse BID  . insulin aspart  0-9 Units Subcutaneous 6 times per day  . pantoprazole (PROTONIX) IV  40 mg Intravenous Q24H  . sodium chloride  3 mL Intravenous Q12H   Assessment/Plan:  Active Problems:   Cardiac arrest   Ventricular fibrillation   NSTEMI (non-ST elevated myocardial infarction)   Aortic stenosis  80 year old with VFIB arrest 06/09/2014. Severe 3v CAD, EF 30%  1. Cardiac arrest - VFIB. Cath performed. Severe multivessel coronary artery disease with severe stenosis of the LAD, and critical stenosis of the left circumflex which is a dominant vessel. Extensive collaterals. No PCI performed.  Continue heparin and ASA; add beta blocker in AM if stable; add statin later. DC amiodarone.  2. Severe aortic stenosis - if improves, consider CABG with AVR.   3. Hypothermia - per CCM.   4. VDRF - per CCM      Olga MillersBrian Crenshaw 06/30/2014, 9:49 AM

## 2014-06-30 NOTE — Progress Notes (Signed)
ANTICOAGULATION CONSULT NOTE - Follow Up Consult  Pharmacy Consult for Heparin  Indication: chest pain/ACS  No Known Allergies  Patient Measurements: Height: 5\' 9"  (175.3 cm) Weight: 150 lb (68.04 kg) IBW/kg (Calculated) : 70.7  Vital Signs: Temp: 93.9 F (34.4 C) (07/25 0100) Temp src: Core (Comment) (07/25 0100) BP: 118/48 mmHg (07/24 2320) Pulse Rate: 61 (07/25 0100)  Labs:  Recent Labs  07/12/2014 1200 07/12/14 1238  07/12/2014 1607  July 12, 2014 2006 July 12, 2014 2016  06/29/14 0400  06/29/14 1200 06/29/14 1320 06/29/14 1600 06/29/14 2000 06/30/14  HGB 10.5*  --   < > 13.9  --   --  14.3  --  12.3*  --   --   --   --   --   --   HCT 32.3*  --   < > 41.0  --   --  42.0  --  36.8*  --   --   --   --   --   --   PLT 202  --   --   --   --   --   --   --  231  --   --   --   --   --   --   APTT  --  32  --   --   --  31  --   --   --   --   --   --   --   --   --   LABPROT  --  16.5*  --   --   --  16.5*  --   --   --   --   --   --   --   --   --   INR  --  1.33  --   --   --  1.33  --   --   --   --   --   --   --   --   --   HEPARINUNFRC  --   --   --   --   --   --   --   < > 0.25*  --   --  0.57  --  0.54 0.46  CREATININE  --  1.16  < > 1.10  < >  --  1.10  < > 0.99  < >  --   --  0.98 1.08 1.19  TROPONINI 0.38*  --   --   --   --   --   --   --   --   --  5.29*  --   --   --   --   < > = values in this interval not displayed.  Estimated Creatinine Clearance: 45.2 ml/min (by C-G formula based on Cr of 1.19).   Medications:  Heparin 750 units/hr  Assessment: 80 y/o M on arctic sun protocol--RE-WARMING started on 7/24 at 1600, therapeutic HL of 0.46, other labs as above.  Goal of Therapy:  Heparin level 0.3-0.7 units/ml Monitor platelets by anticoagulation protocol: Yes   Plan:  -Continue heparin at 750 units/hr -Daily CBC/HL -Monitor for bleeding  Abran Duke 06/30/2014,1:12 AM

## 2014-06-30 NOTE — Progress Notes (Signed)
Pt spontaneously opened eyes. No tracking noted. Unable to follow commands. No withdrawal to pain observed.

## 2014-06-30 NOTE — Progress Notes (Signed)
Hypoglycemic Event  CBG: 59  Treatment: D50 IV 25 mL  Symptoms: None  Follow-up CBG: Time:2007  CBG Result:82  Possible Reasons for Event: Inadequate meal intake    Bryan Long J  Remember to initiate Hypoglycemia Order Set & complete

## 2014-06-30 NOTE — Progress Notes (Signed)
ANTICOAGULATION CONSULT NOTE - Follow Up Consult  Pharmacy Consult for Heparin  Indication: chest pain/ACS  No Known Allergies  Patient Measurements: Height: 5\' 9"  (175.3 cm) Weight: 150 lb (68.04 kg) IBW/kg (Calculated) : 70.7  Vital Signs: Temp: 96.8 F (36 C) (07/25 0800) Temp src: Core (Comment) (07/25 0800) BP: 150/50 mmHg (07/25 0319) Pulse Rate: 79 (07/25 0900)  Labs:  Recent Labs  Jul 20, 2014 1200 07/20/2014 1238  2014/07/20 2006 2014-07-20 2016  06/29/14 0400  06/29/14 1200  06/29/14 2000 06/30/14 06/30/14 0400  HGB 10.5*  --   < >  --  14.3  --  12.3*  --   --   --   --   --  12.2*  HCT 32.3*  --   < >  --  42.0  --  36.8*  --   --   --   --   --  35.4*  PLT 202  --   --   --   --   --  231  --   --   --   --   --  224  APTT  --  32  --  31  --   --   --   --   --   --   --   --   --   LABPROT  --  16.5*  --  16.5*  --   --   --   --   --   --   --   --   --   INR  --  1.33  --  1.33  --   --   --   --   --   --   --   --   --   HEPARINUNFRC  --   --   --   --   --   --  0.25*  --   --   < > 0.54 0.46 0.40  CREATININE  --  1.16  < >  --  1.10  < > 0.99  < >  --   < > 1.08 1.19 1.33  TROPONINI 0.38*  --   --   --   --   --   --   --  5.29*  --   --   --   --   < > = values in this interval not displayed.  Estimated Creatinine Clearance: 40.5 ml/min (by C-G formula based on Cr of 1.33).   Medications:  Heparin 750 units/hr  Assessment: 80 y/o M on arctic sun protocol--RE-WARMING started on 7/24 at 1600, and patient on heparin at 750 units/hr with  therapeutic HL of 0.40 (trend down noted).  Goal of Therapy:  Heparin level 0.3-0.7 units/ml Monitor platelets by anticoagulation protocol: Yes   Plan:  -Continue heparin at 750 units/hr -Daily CBC/HL -Monitor for bleeding  [name], Pharm D 06/30/2014 9:20 AM

## 2014-06-30 NOTE — Progress Notes (Signed)
E-link MD notified of pt stacking breaths off sedation and increasing RR. New sedation orders received. Will continue to monitor.

## 2014-06-30 NOTE — Progress Notes (Signed)
Changed vent mode back to [facility] due to apnea periods per RN.

## 2014-06-30 NOTE — Progress Notes (Signed)
Pt's RR increasing and stacking of breaths noted. Sedation restarted. Will continue to monitor.

## 2014-06-30 NOTE — Progress Notes (Signed)
eLink Physician-Brief Progress Note Patient Name: KARIM MCCOIG DOB: 01-Mar-1929 MRN: [id number]  Date of Service  06/30/2014   HPI/Events of Note  Completed re-warming off sedation and paralytic.  Now breath stacking and appears uncomfortable.  Not following commands   eICU Interventions  Plan: Use propofol with prn fentanyl for now. To be re-evaluated on rounds this AM   Intervention Category Minor Interventions: Agitation / anxiety - evaluation and management  Jennalyn Cawley 06/30/2014, 6:36 AM

## 2014-06-30 NOTE — Progress Notes (Signed)
Hypoglycemic Event  CBG: 65  Treatment: D50 IV 25 mL  Symptoms: None  Follow-up CBG: Time:2355 CBG Result:86  Possible Reasons for Event: Inadequate meal intake  Comments/MD notified: Notified PCCM. Orders for D5 1/2 NS gtt.     Toula Moos

## 2014-06-30 NOTE — Progress Notes (Signed)
30 mL versed IV wasted in sink. Witnessed by [employer], Charity fundraiser.

## 2014-06-30 NOTE — Progress Notes (Signed)
FOLLOW-UP NUTRITION ASSESSMENT  INTERVENTION:  Initiate Vital AF 1.2 formula at 20 ml/hr via OGT and increase by 10 ml every 4 hours to goal rate of 50 ml/hr to provide 1440 kcals, 90 gm protein,  973 ml of free water  TF plus Propofol will provide 1601 kcal (104% of estimated needs) RD to follow for nutrition care plan  NUTRITION DIAGNOSIS: Inadequate oral intake related to inability to eat as evidenced by NPO status; ongoing  Goal: Initiation of nutrition support in next 24-48 hours if prolonged intubation expected; currently unmet  Monitor:  TF initiation & tolerance, respiratory status, weight, labs, I/O's  80 y.o. male  Admitting Dx: cardiac arrest  ASSESSMENT: 80 yo admiited 7/23 after VF arrest at home. Approximate 15 minute downtime. Intubated in field, required shock x 3 + ACLS.  Patient is currently intubated on ventilator support -- OGT in place MV: 8.3 L/min Temp (24hrs), Avg:95.1 F (35.1 C), Min:90 F (32.2 C), Max:99.5 F (37.5 C) Propofol: 6.1 ml/hr (Provides 161 kcal per 24 hours)  7/25: RD consulted for TF management. Patient was on the Hypothermia Protocol. Now temperature is 98.2 F (36.8 C).   Height: Ht Readings from Last 1 Encounters:  07-27-2014 5\' 9"  (1.753 m)    Weight: Wt Readings from Last 1 Encounters:  07/27/14 150 lb (68.04 kg)    BMI:  Body mass index is 22.14 kg/(m^2).  Estimated Nutritional Needs: Kcal: 1540 Protein: 90-105 gm Fluid: per MD  Skin: Intact; +1 RUE and LUE edema  Diet Order: NPO    Intake/Output Summary (Last 24 hours) at 06/30/14 1350 Last data filed at 06/30/14 1300  Gross per 24 hour  Intake 3535.81 ml  Output   1370 ml  Net 2165.81 ml    Labs:   Recent Labs Lab 06/29/14 2000 06/30/14 06/30/14 0400  NA 138 137 138  K 3.7 3.9 4.1  CL 107 108 105  CO2 17* 16* 17*  BUN 19 19 19   CREATININE 1.08 1.19 1.33  CALCIUM 6.9* 7.0* 7.1*  GLUCOSE 118* 96 102*    CBG (last 3)   Recent Labs  06/30/14 1234 06/30/14 1237 06/30/14 1302  GLUCAP 54* 55* 91    Scheduled Meds: . antiseptic oral rinse  15 mL Mouth Rinse QID  . [START ON 07/01/2014] aspirin  81 mg Per Tube Daily  . chlorhexidine  15 mL Mouth Rinse BID  . feeding supplement (VITAL HIGH PROTEIN)  1,000 mL Per Tube Q24H  . free water  200 mL Per Tube 3 times per day  . pantoprazole sodium  40 mg Per Tube Q1200  . sodium chloride  750 mL Intravenous Once  . sodium chloride  3 mL Intravenous Q12H    Continuous Infusions: . sodium chloride 10 mL/hr at 06/29/14 1930  . sodium chloride Stopped (06/30/14 1000)  . sodium chloride 10 mL/hr at 06/29/14 1930  . sodium chloride Stopped (06/30/14 1307)  . heparin 750 Units/hr (06/30/14 7867)  . norepinephrine (LEVOPHED) Adult infusion Stopped (06/30/14 0600)  . propofol 15 mcg/kg/min (06/30/14 0800)   Ian Malkin RD, LDN Inpatient Clinical Dietitian Pager: [phone] After Hours Pager: [phone]

## 2014-06-30 NOTE — Progress Notes (Signed)
PULMONARY / CRITICAL CARE MEDICINE   Name: Bryan Long MRN: [id number] DOB: 1929-01-03    ADMISSION DATE:  07/01/2014  REFERRING MD :  EDP   CHIEF COMPLAINT:  Cardiac arrest  INITIAL PRESENTATION: 80 yo admiited 7/23 after VF arrest at home.  Approximate 15 minute downtime. Intubated in field, required shock x 3 + ACLS.  STUDIES / EVENTS:  7/23  CT head:  neg 7/23  Cardiac Cath: severe multivessel CAD 7/23  Hypothermia started 7/24  TTE EF 30-35%  7/24 PM - rewarmed 7/25 vent dyssynchrony. Propofol initiated  LINES: ETT  7/23 >>  L IJ CVL 7/23 >>  R radial art-line 7/23 >>    CULTURES: Urine 7/23 >> NEG  ANTIBIOTICS:  INTERVAL HISTORY:  Presently RASS -4 on propofol. Vent dyssynchrony reported by RN  VITAL SIGNS: Temp:  [90 F (32.2 C)-99.5 F (37.5 C)] 98.2 F (36.8 C) (07/25 1000) Pulse Rate:  [51-94] 69 (07/25 1100) Resp:  [0-21] 15 (07/25 1100) BP: (118-163)/(48-55) 150/50 mmHg (07/25 0319) SpO2:  [95 %-100 %] 100 % (07/25 1100) Arterial Line BP: (102-178)/(41-64) 103/42 mmHg (07/25 1100) FiO2 (%):  [40 %] 40 % (07/25 1139)  HEMODYNAMICS: CVP:  [3 mmHg-7 mmHg] 3 mmHg  VENTILATOR SETTINGS: Vent Mode:  [-] PRVC FiO2 (%):  [40 %] 40 % Set Rate:  [16 bmp] 16 bmp Vt Set:  [580 mL] 580 mL PEEP:  [5 cmH20] 5 cmH20 Pressure Support:  [10 cmH20] 10 cmH20 Plateau Pressure:  [17 cmH20-22 cmH20] 17 cmH20  INTAKE / OUTPUT: Intake/Output     07/24 0701 - 07/25 0700 07/25 0701 - 07/26 0700   I.V. (mL/kg) 2964.2 (43.6) 255.1 (3.7)   NG/GT 40 40   IV Piggyback 1000    Total Intake(mL/kg) 4004.2 (58.9) 295.1 (4.3)   Urine (mL/kg/hr) 1133 (0.7) 22 (0.1)   Emesis/NG output 760 (0.5)    Total Output 1893 22   Net +2111.2 +273.1          PHYSICAL EXAMINATION: General: RASS -4 Neuro: MAEs spontaneously HEENT:  PERRL, NCAT Neck: C collar Cardiovascular:  RRR s M Lungs: clear anteriorly Abdomen:  Soft, +BS Ext: warm, no edema  LABS: I have reviewed all  of today's lab results. Relevant abnormalities are discussed in the A/P section  CXR: NNF  ASSESSMENT / PLAN:  PULMONARY A: Acute respiratory failure in setting of cardiac arrest Possible aspiration pneumonia P:  Cont full vent support - settings reviewed and/or adjusted Cont vent bundle Daily SBT if/when meets criteria  CARDIOVASCULAR A:  S/P VF arrest 7/23 NSTEMI Severe multivessel CAD Cardiomyopathy (LVEF 30-35%) Mild hypotension P:  NS bolus X 1 7/25 Resume NE as needed to maintain MAP > 65 mmHg Cont ASA, statin Cards following  RENAL A:   No acute issues P:   Monitor BMET intermittently Monitor I/Os Correct electrolytes as indicated  GASTROINTESTINAL A:   Mildly elevated LFTs P:   SUP: enteral PPI Begin TFs 7/25 Monitor LFTs intermittently  HEMATOLOGIC A:   Mild anemia without acute blood loss P:  DVT px: full dose heparin Monitor CBC intermittently Transfuse per usual ICU guidelines  INFECTIOUS A:   Reactive leucocytosis P:   Micro and abx as above  ENDOCRINE A:   Hyperglycemia resolved P:   DC SSI  CBGs q 8 hrs Resume SSI for glu > 180  NEUROLOGIC A:   Post anoxic acute encephalopathy Cervical collar in place without evidence of C spine injury P:   RASS goal -  1 Cont propofol Cont PRN fent Daily WUA Consider repeat CT head and/or EEG 7/26 Will need C spine cleared  I have personally obtained history, examined patient, evaluated and interpreted laboratory and imaging results, reviewed medical records, formulated assessment / plan and placed orders.  CRITICAL CARE:  The patient is critically ill with multiple organ systems failure and requires high complexity decision making for assessment and support, frequent evaluation and titration of therapies, application of advanced monitoring technologies and extensive interpretation of multiple databases. Critical Care Time devoted to patient care services described in this note is 40  minutes.   Billy Fischer, MD Pulmonary and Critical Care Medicine [facility] Pager: [phone]  06/30/2014, 12:53 PM

## 2014-07-01 ENCOUNTER — Inpatient Hospital Stay (HOSPITAL_COMMUNITY): Payer: Medicare Other

## 2014-07-01 DIAGNOSIS — N179 Acute kidney failure, unspecified: Secondary | ICD-10-CM

## 2014-07-01 DIAGNOSIS — J189 Pneumonia, unspecified organism: Secondary | ICD-10-CM

## 2014-07-01 LAB — CBC
HEMATOCRIT: 32.2 % — AB (ref 39.0–52.0)
HEMOGLOBIN: 10.7 g/dL — AB (ref 13.0–17.0)
MCH: 30 pg (ref 26.0–34.0)
MCHC: 33.2 g/dL (ref 30.0–36.0)
MCV: 90.2 fL (ref 78.0–100.0)
Platelets: 196 10*3/uL (ref 150–400)
RBC: 3.57 MIL/uL — AB (ref 4.22–5.81)
RDW: 17.2 % — ABNORMAL HIGH (ref 11.5–15.5)
WBC: 16 10*3/uL — AB (ref 4.0–10.5)

## 2014-07-01 LAB — BASIC METABOLIC PANEL
Anion gap: 17 — ABNORMAL HIGH (ref 5–15)
BUN: 27 mg/dL — ABNORMAL HIGH (ref 6–23)
CO2: 17 meq/L — AB (ref 19–32)
Calcium: 7 mg/dL — ABNORMAL LOW (ref 8.4–10.5)
Chloride: 104 mEq/L (ref 96–112)
Creatinine, Ser: 1.81 mg/dL — ABNORMAL HIGH (ref 0.50–1.35)
GFR calc Af Amer: 38 mL/min — ABNORMAL LOW (ref >90)
GFR calc non Af Amer: 33 mL/min — ABNORMAL LOW (ref >90)
GLUCOSE: 106 mg/dL — AB (ref 70–99)
POTASSIUM: 4.7 meq/L (ref 3.7–5.3)
SODIUM: 138 meq/L (ref 137–147)

## 2014-07-01 LAB — HEPATIC FUNCTION PANEL
ALBUMIN: 2.5 g/dL — AB (ref 3.5–5.2)
ALT: 98 U/L — AB (ref 0–53)
AST: 120 U/L — ABNORMAL HIGH (ref 0–37)
Alkaline Phosphatase: 72 U/L (ref 39–117)
Bilirubin, Direct: <0.2 mg/dL (ref 0.0–0.3)
TOTAL PROTEIN: 5.7 g/dL — AB (ref 6.0–8.3)
Total Bilirubin: 0.5 mg/dL (ref 0.3–1.2)

## 2014-07-01 LAB — GLUCOSE, CAPILLARY
GLUCOSE-CAPILLARY: 91 mg/dL (ref 70–99)
GLUCOSE-CAPILLARY: 80 mg/dL (ref 70–99)
Glucose-Capillary: 76 mg/dL (ref 70–99)
Glucose-Capillary: 89 mg/dL (ref 70–99)
Glucose-Capillary: 74 mg/dL (ref 70–99)

## 2014-07-01 LAB — HEPARIN LEVEL (UNFRACTIONATED)
Heparin Unfractionated: 0.19 IU/mL — ABNORMAL LOW (ref 0.30–0.70)
Heparin Unfractionated: 0.23 IU/mL — ABNORMAL LOW (ref 0.30–0.70)

## 2014-07-01 MED ORDER — LEVOFLOXACIN IN D5W 500 MG/100ML IV SOLN
500.0000 mg | Freq: Once | INTRAVENOUS | Status: AC
  Administered 2014-07-01: 500 mg via INTRAVENOUS
  Filled 2014-07-01: qty 100

## 2014-07-01 MED ORDER — METOPROLOL TARTRATE 12.5 MG HALF TABLET
12.5000 mg | ORAL_TABLET | Freq: Two times a day (BID) | ORAL | Status: DC
  Administered 2014-07-01 (×2): 12.5 mg via ORAL
  Filled 2014-07-01 (×4): qty 1

## 2014-07-01 MED ORDER — LEVOFLOXACIN IN D5W 250 MG/50ML IV SOLN
250.0000 mg | INTRAVENOUS | Status: DC
  Filled 2014-07-01: qty 50

## 2014-07-01 MED ORDER — DEXTROSE 10 % IV SOLN
INTRAVENOUS | Status: DC
  Administered 2014-07-01: 25 mL via INTRAVENOUS
  Administered 2014-07-04: 07:00:00 via INTRAVENOUS

## 2014-07-01 NOTE — Progress Notes (Signed)
Chaplain Bradford met with family (wife, daughter) bedside vigil for father in coma from stroke post hypothermia [place]. Prayed with family members waiting on outcome of morning attempt to stir patient from coma 10 am, with Dr Simmons. Family members are exhausted. ° °[name] °

## 2014-07-01 NOTE — Progress Notes (Signed)
PULMONARY / CRITICAL CARE MEDICINE   Name: Bryan Long MRN: [id number] DOB: [birth year]    ADMISSION DATE:  07/01/2014  REFERRING MD :  EDP   CHIEF COMPLAINT:  Cardiac arrest  INITIAL PRESENTATION: 80 yo admiited 7/23 after VF arrest at home.  Approximate 15 minute downtime. Intubated in field, required shock x 3 + ACLS.  STUDIES / EVENTS:  7/23  CT head:  neg 7/23  Cardiac Cath: severe multivessel CAD 7/23  Hypothermia started 7/24  TTE EF 30-35%  7/24 PM - rewarmed 7/25 vent dyssynchrony. Propofol initiated 7/26 Tolerates PS 5-10 cm H2O. Cognition prohibits extubation. RASS -3. Not F/C.  7/26 CT head: NAICP 7/26 CT neck: spondylosis, osteoarthritis, carotid calcifications. No subluxations  LINES: ETT  7/23 >>  L IJ CVL 7/23 >>  R radial art-line 7/23 >>    CULTURES: Urine 7/23 >> NEG Resp 7/26 >>   ANTIBIOTICS: Levofloxacin 7/26 >>   INTERVAL HISTORY:  Tolerates PS 5-10 cm H2O. Cognition prohibits extubation. RASS -3. Not F/C.   VITAL SIGNS: Temp:  [97 F (36.1 C)-99.5 F (37.5 C)] 98.4 F (36.9 C) (07/26 1600) Pulse Rate:  [73-107] 89 (07/26 1400) Resp:  [11-35] 18 (07/26 1600) BP: (116-122)/(45-47) 120/47 mmHg (07/26 0324) SpO2:  [97 %-100 %] 99 % (07/26 1600) Arterial Line BP: (95-142)/(32-51) 125/47 mmHg (07/26 1600) FiO2 (%):  [40 %] 40 % (07/26 1509) Weight:  [70 kg (154 lb 5.2 oz)] 70 kg (154 lb 5.2 oz) (07/26 0500)  HEMODYNAMICS: CVP:  [7 mmHg-10 mmHg] 9 mmHg  VENTILATOR SETTINGS: Vent Mode:  [-] PRVC FiO2 (%):  [40 %] 40 % Set Rate:  [14 bmp] 14 bmp Vt Set:  [580 mL] 580 mL PEEP:  [5 cmH20] 5 cmH20 Pressure Support:  [5 cmH20-14 cmH20] 14 cmH20 Plateau Pressure:  [8 cmH20-18 cmH20] 17 cmH20  INTAKE / OUTPUT: Intake/Output     07/25 0701 - 07/26 0700 07/26 0701 - 07/27 0700   I.V. (mL/kg) 1616.4 (23.1) 683 (9.8)   NG/GT 783 450   IV Piggyback 750 100   Total Intake(mL/kg) 3149.4 (45) 1233 (17.6)   Urine (mL/kg/hr) 507 (0.3) 450 (0.7)    Emesis/NG output     Total Output 507 450   Net +2642.4 +783          PHYSICAL EXAMINATION: General: RASS -3 Neuro: MAEs spontaneously HEENT:  PERRL, NCAT Neck: C collar Cardiovascular:  RRR s M Lungs: coarse BS bialterally, purulent ET secretions Abdomen:  Soft, +BS Ext: warm, no edema  LABS: I have reviewed all of today's lab results. Relevant abnormalities are discussed in the A/P section  CXR: improved IS prominence. New, ill defined RLL AS dz  ASSESSMENT / PLAN:  PULMONARY A: Acute respiratory failure in setting of cardiac arrest Probable pneumonia P:  Cont full vent support - settings reviewed and/or adjusted Cont vent bundle Daily SBT if/when meets criteria  CARDIOVASCULAR A:  S/P VF arrest 7/23 NSTEMI Severe multivessel CAD Cardiomyopathy (LVEF 30-35%) Mild hypotension, resolved P:  Cont NE as needed to maintain MAP > 65 mmHg Cont ASA, statin Cards following  RENAL A:   AKI, nonoliguric P:   Monitor BMET intermittently Monitor I/Os Correct electrolytes as indicated  GASTROINTESTINAL A:   Mildly elevated LFTs P:   SUP: enteral PPI Cont TFs Monitor LFTs intermittently  HEMATOLOGIC A:   Mild anemia without acute blood loss P:  DVT px: full dose heparin Monitor CBC intermittently Transfuse per usual ICU guidelines  INFECTIOUS A:  Fever, leukocytosis with new RLL infiltrate and purulent secretions Probable RLL early HCAP P:   Micro and abx as above  ENDOCRINE A:   Hyperglycemia resolved P:   CBGs q 8 hrs Resume SSI for glu > 180  NEUROLOGIC A:   Post anoxic acute encephalopathy Cervical collar in place P:   RASS goal -1 Resume propofol as needed Cont PRN fent Daily WUA Will need C spine cleared clinically when able to participate in exam  I have personally obtained history, examined patient, evaluated and interpreted laboratory and imaging results, reviewed medical records, formulated assessment / plan and placed  orders.  CRITICAL CARE:  The patient is critically ill with multiple organ systems failure and requires high complexity decision making for assessment and support, frequent evaluation and titration of therapies, application of advanced monitoring technologies and extensive interpretation of multiple databases. Critical Care Time devoted to patient care services described in this note is 40 minutes.   Family updated in detail  Billy Fischeravid Aasir Daigler, MD Pulmonary and Critical Care Medicine [facility] Pager: [phone]  07/01/2014, 4:31 PM

## 2014-07-01 NOTE — Progress Notes (Signed)
Pharmacist Heart Failure Core Measure Documentation  Assessment: Bryan Long has an EF documented as 30-35% on 06/29/14 by Echo.  Rationale: Heart failure patients with left ventricular systolic dysfunction (LVSD) and an EF < 40% should be prescribed an angiotensin converting enzyme inhibitor (ACEI) or angiotensin receptor blocker (ARB) at discharge unless a contraindication is documented in the medical record.  This patient is not currently on an ACEI or ARB for HF.  This note is being placed in the record in order to provide documentation that a contraindication to the use of these agents is present for this encounter.  ACE Inhibitor or Angiotensin Receptor Blocker is contraindicated (specify all that apply)  []   ACEI allergy AND ARB allergy []   Angioedema []   Moderate or severe aortic stenosis []   Hyperkalemia []   Hypotension []   Renal artery stenosis [x]   Worsening renal function, preexisting renal disease or dysfunction  [name], Pharm D 07/01/2014 9:37 AM

## 2014-07-01 NOTE — Progress Notes (Signed)
ANTICOAGULATION CONSULT NOTE  Pharmacy Consult for Heparin  Indication: chest pain/ACS  No Known Allergies  Patient Measurements: Height: 5\' 9"  (175.3 cm) Weight: 150 lb (68.04 kg) IBW/kg (Calculated) : 70.7  Vital Signs: Temp: 98.6 F (37 C) (07/26 0339) Temp src: Core (Comment) (07/26 0339) BP: 120/47 mmHg (07/26 0324) Pulse Rate: 86 (07/26 0324)  Labs:  Recent Labs  06/20/2014 1200 06/17/2014 1238  06/10/2014 2006  06/29/14 0400  06/29/14 1200  06/30/14 06/30/14 0400 07/01/14 0315  HGB 10.5*  --   < >  --   < > 12.3*  --   --   --   --  12.2* 10.7*  HCT 32.3*  --   < >  --   < > 36.8*  --   --   --   --  35.4* 32.2*  PLT 202  --   --   --   --  231  --   --   --   --  224 196  APTT  --  32  --  31  --   --   --   --   --   --   --   --   LABPROT  --  16.5*  --  16.5*  --   --   --   --   --   --   --   --   INR  --  1.33  --  1.33  --   --   --   --   --   --   --   --   HEPARINUNFRC  --   --   --   --   --  0.25*  --   --   < > 0.46 0.40 0.19*  CREATININE  --  1.16  < >  --   < > 0.99  < >  --   < > 1.19 1.33 1.81*  TROPONINI 0.38*  --   --   --   --   --   --  5.29*  --   --   --   --   < > = values in this interval not displayed.  Estimated Creatinine Clearance: 29.7 ml/min (by C-G formula based on Cr of 1.81).  Assessment: 80 y/o male with cardiac arrest s/p hypothermia protocol, for heparin  Goal of Therapy:  Heparin level 0.3-0.7 units/ml Monitor platelets by anticoagulation protocol: Yes   Plan:  Increase Heparin 950 units/hr Check heparin level in 8 hours.  Geannie Risen, PharmD, BCPS  07/01/2014 4:35 AM

## 2014-07-01 NOTE — Progress Notes (Signed)
ANTICOAGULATION CONSULT NOTE - Follow Up Consult  Pharmacy Consult for heparin Indication: chest pain/ACS  No Known Allergies  Patient Measurements: Height: 5\' 9"  (175.3 cm) Weight: 154 lb 5.2 oz (70 kg) IBW/kg (Calculated) : 70.7 Heparin Dosing Weight: 70 kg  Vital Signs: Temp: 97 F (36.1 C) (07/26 1200) Temp src: Core (Comment) (07/26 1200) Pulse Rate: 89 (07/26 1400)  Labs:  Recent Labs  07/01/2014 2006  06/29/14 0400  06/29/14 1200  06/30/14 06/30/14 0400 07/01/14 0315 07/01/14 1500  HGB  --   < > 12.3*  --   --   --   --  12.2* 10.7*  --   HCT  --   < > 36.8*  --   --   --   --  35.4* 32.2*  --   PLT  --   --  231  --   --   --   --  224 196  --   APTT 31  --   --   --   --   --   --   --   --   --   LABPROT 16.5*  --   --   --   --   --   --   --   --   --   INR 1.33  --   --   --   --   --   --   --   --   --   HEPARINUNFRC  --   --  0.25*  --   --   < > 0.46 0.40 0.19* 0.23*  CREATININE  --   < > 0.99  < >  --   < > 1.19 1.33 1.81*  --   TROPONINI  --   --   --   --  5.29*  --   --   --   --   --   < > = values in this interval not displayed.  Estimated Creatinine Clearance: 30.6 ml/min (by C-G formula based on Cr of 1.81).  Assessment: Patient is an 80 y.o M s/p cardiac arrest, hypothermia, and cath that showed severe multivessel CAD.  Heparin level now back subtherapeutic at 0.23 with current rate of 950 units/hr. Hgb and platelets down slightly to 10.7 and 196, respectively.  No bleeding documented.   Goal of Therapy:  Heparin level 0.3-0.7 units/ml Monitor platelets by anticoagulation protocol: Yes   Plan:  1) increase heparin drip to 1100 units/hr 2) check 8 hour heparin level  Danasia Baker P 07/01/2014,3:33 PM

## 2014-07-01 NOTE — Progress Notes (Signed)
ANTIBIOTIC CONSULT NOTE - INITIAL  Pharmacy Consult for levaquin Indication: CAP  No Known Allergies  Patient Measurements: Height: 5\' 9"  (175.3 cm) Weight: 154 lb 5.2 oz (70 kg) IBW/kg (Calculated) : 70.7  Vital Signs: Temp: 97 F (36.1 C) (07/26 1200) Temp src: Core (Comment) (07/26 1200) BP: 120/47 mmHg (07/26 0324) Pulse Rate: 73 (07/26 1200) Intake/Output from previous day: 07/25 0701 - 07/26 0700 In: 3149.4 [I.V.:1616.4; NG/GT:783; IV Piggyback:750] Out: 507 [Urine:507] Intake/Output from this shift: Total I/O In: 676.7 [I.V.:426.7; NG/GT:250] Out: 275 [Urine:275]  Labs:  Recent Labs  06/29/14 0400  06/30/14 06/30/14 0400 07/01/14 0315  WBC 15.6*  --   --  15.1* 16.0*  HGB 12.3*  --   --  12.2* 10.7*  PLT 231  --   --  224 196  CREATININE 0.99  < > 1.19 1.33 1.81*  < > = values in this interval not displayed. Estimated Creatinine Clearance: 30.6 ml/min (by C-G formula based on Cr of 1.81). No results found for this basename: Rolm Gala, VANCORANDOM, GENTTROUGH, GENTPEAK, GENTRANDOM, TOBRATROUGH, TOBRAPEAK, TOBRARND, AMIKACINPEAK, AMIKACINTROU, AMIKACIN,  in the last 72 hours   Microbiology: Recent Results (from the past 720 hour(s))  URINE CULTURE     Status: None   Collection Time    2014/07/14 12:05 PM      Result Value Ref Range Status   Specimen Description URINE, CATHETERIZED   Final   Special Requests NONE   Final   Culture  Setup Time     Final   Value: 2014/07/14 18:29     Performed at [facility] Count     Final   Value: NO GROWTH     Performed at [facility]   Culture     Final   Value: NO GROWTH     Performed at [facility]   Report Status 06/29/2014 FINAL   Final  MRSA PCR SCREENING     Status: None   Collection Time    July 14, 2014  5:20 PM      Result Value Ref Range Status   MRSA by PCR NEGATIVE  NEGATIVE Final   Comment:            The GeneXpert MRSA Assay (FDA     approved for NASAL  specimens     only), is one component of a     comprehensive MRSA colonization     surveillance program. It is not     intended to diagnose MRSA     infection nor to guide or     monitor treatment for     MRSA infections.    Medical History: Past Medical History  Diagnosis Date  . Hyperlipidemia   . Peripheral vascular disease     Medications:  Scheduled:  . antiseptic oral rinse  15 mL Mouth Rinse QID  . aspirin  81 mg Per Tube Daily  . chlorhexidine  15 mL Mouth Rinse BID  . free water  200 mL Per Tube 3 times per day  . [START ON 07/02/2014] levofloxacin (LEVAQUIN) IV  250 mg Intravenous Q24H  . levofloxacin (LEVAQUIN) IV  500 mg Intravenous Once  . metoprolol tartrate  12.5 mg Oral BID  . pantoprazole sodium  40 mg Per Tube Q1200  . sodium chloride  3 mL Intravenous Q12H    Assessment: 80 yo male tos/p cardiac arrest cath with multivessel CAD. Patient now with nocnern of CAP and pharmacy asked to dose levaquin.  WBC= 16, tmax= 99.5, SCr= 1.81 (up from 1.33), CrCl ~ 30.  levaquin 7/26>>  7/26 resp 7/23 urine- neg  Plan:  -Levaquin 500mg  IV x1 followed by 250mg  iV q24hr -Will follow renal function, cultures and clinical progress  Harland Germanndrew Law Corsino, Pharm D 07/01/2014 2:35 PM

## 2014-07-01 NOTE — Progress Notes (Signed)
Cardiologist: Bryan Long  Subjective:   80 year old Bryan Long patient, cutting grass and collapsed. EMS prompt. DC CV 3 times. 15 min CPR. Wife started CPR.  7/24 - sedate, cooled.  Intubated and sedated this AM. Objective:  Vital Signs in the last 24 hours: Temp:  [98.1 F (36.7 C)-99.5 F (37.5 C)] 98.4 F (36.9 C) (07/26 0800) Pulse Rate:  [66-102] 102 (07/26 0900) Resp:  [11-27] 20 (07/26 0900) BP: (103-122)/(44-47) 120/47 mmHg (07/26 0324) SpO2:  [99 %-100 %] 99 % (07/26 0900) Arterial Line BP: (95-133)/(32-50) 132/45 mmHg (07/26 0900) FiO2 (%):  [40 %] 40 % (07/26 0818) Weight:  [154 lb 5.2 oz (70 kg)] 154 lb 5.2 oz (70 kg) (07/26 0500)  Intake/Output from previous day: 07/25 0701 - 07/26 0700 In: 3149.4 [I.V.:1616.4; NG/GT:783; IV Piggyback:750] Out: 507 [Urine:507]   Physical Exam: General: Well developed, sedated, intubated Head:  Normal. ETT Lungs: Mild diffuse rhonchi Heart: Normal S1 and S2.  3/6 systolic murmur RUSB Abdomen: soft, non-tender, Not distended Extremities: No edema. Neurologic: Sedated.    Lab Results:  Recent Labs  06/30/14 0400 07/01/14 0315  WBC 15.1* 16.0*  HGB 12.2* 10.7*  PLT 224 196    Recent Labs  06/30/14 0400 07/01/14 0315  NA 138 138  K 4.1 4.7  CL 105 104  CO2 17* 17*  GLUCOSE 102* 106*  BUN 19 27*  CREATININE 1.33 1.81*    Recent Labs  06/24/2014 1200 06/29/14 1200  TROPONINI 0.38* 5.29*   Hepatic Function Panel  Recent Labs  07/01/14 0315  PROT 5.7*  ALBUMIN 2.5*  AST 120*  ALT 98*  ALKPHOS 72  BILITOT 0.5  BILIDIR <0.2  IBILI NOT CALCULATED    Imaging: Dg Chest Port 1 View  07/01/2014   CLINICAL DATA:  f/u respiratory failure  EXAM: PORTABLE CHEST - 1 VIEW  COMPARISON:  07/01/2014  FINDINGS: Endotracheal tube, left IJ central line, and nasogastric tube stable in position. Heart size upper limits normal. Some interval improvement in the diffuse interstitial edema with some residual pulmonary  vascular congestion. There are new patchy airspace opacities in the lung bases right greater than left. Suspect small right pleural effusion. Degenerative changes in the right shoulder noted  IMPRESSION: 1. Improving interstitial edema with increasing asymmetric basilar airspace disease right greater than left. 2.  Support hardware stable in position.   Electronically Signed   By: Oley Balmaniel  Hassell M.D.   On: 07/01/2014 07:58     Telemetry:  Sinus    Cardiac Studies:  ECHO - - Inferior and inferolateral akinesis; remaining walls hypokinetic;overall EF 30-35; mildly reduced RV function; mild biatrial enlargement; severely calcified aortic valve with reduced cusp excursion; severe AS (valve area 0.6 cm2 by continuity equation; dimensionless index 0.17); mild AI; mild MR; probable grade 3 diastolic dysfunction.    Marland Kitchen. antiseptic oral rinse  15 mL Mouth Rinse QID  . aspirin  81 mg Per Tube Daily  . chlorhexidine  15 mL Mouth Rinse BID  . free water  200 mL Per Tube 3 times per day  . pantoprazole sodium  40 mg Per Tube Q1200  . sodium chloride  3 mL Intravenous Q12H   Assessment/Plan:  Active Problems:   Cardiac arrest   Ventricular fibrillation   NSTEMI (non-ST elevated myocardial infarction)   Aortic stenosis  80 year old with VFIB arrest 06/16/2014. Severe 3v CAD, EF 30%  1. Cardiac arrest - VFIB. Cath performed. Severe multivessel coronary artery disease with severe  stenosis of the LAD, and critical stenosis of the left circumflex which is a dominant vessel. Extensive collaterals. No PCI performed.  Continue heparin and ASA; add metoprolol 12.5 mg via tube BID; add statin later.   2. Severe aortic stenosis - if improves, consider CABG with AVR.   3. Hypothermia - per CCM.   4. VDRF - per CCM  5. Possible anoxic encephalopathy; may need neurology evaluation.      Bryan Long 07/01/2014, 9:06 AM

## 2014-07-02 ENCOUNTER — Inpatient Hospital Stay (HOSPITAL_COMMUNITY): Payer: Medicare Other

## 2014-07-02 DIAGNOSIS — J189 Pneumonia, unspecified organism: Secondary | ICD-10-CM

## 2014-07-02 DIAGNOSIS — G931 Anoxic brain damage, not elsewhere classified: Secondary | ICD-10-CM | POA: Diagnosis present

## 2014-07-02 DIAGNOSIS — G40901 Epilepsy, unspecified, not intractable, with status epilepticus: Secondary | ICD-10-CM | POA: Diagnosis present

## 2014-07-02 LAB — CBC
HCT: 28.3 % — ABNORMAL LOW (ref 39.0–52.0)
Hemoglobin: 9.5 g/dL — ABNORMAL LOW (ref 13.0–17.0)
MCH: 29.8 pg (ref 26.0–34.0)
MCHC: 33.6 g/dL (ref 30.0–36.0)
MCV: 88.7 fL (ref 78.0–100.0)
Platelets: 157 10*3/uL (ref 150–400)
RBC: 3.19 MIL/uL — AB (ref 4.22–5.81)
RDW: 16.7 % — AB (ref 11.5–15.5)
WBC: 10.9 10*3/uL — AB (ref 4.0–10.5)

## 2014-07-02 LAB — BASIC METABOLIC PANEL
ANION GAP: 13 (ref 5–15)
BUN: 33 mg/dL — ABNORMAL HIGH (ref 6–23)
CO2: 17 mEq/L — ABNORMAL LOW (ref 19–32)
Calcium: 7.3 mg/dL — ABNORMAL LOW (ref 8.4–10.5)
Chloride: 106 mEq/L (ref 96–112)
Creatinine, Ser: 1.31 mg/dL (ref 0.50–1.35)
GFR, EST AFRICAN AMERICAN: 56 mL/min — AB (ref >90)
GFR, EST NON AFRICAN AMERICAN: 49 mL/min — AB (ref >90)
Glucose, Bld: 121 mg/dL — ABNORMAL HIGH (ref 70–99)
Potassium: 4.1 mEq/L (ref 3.7–5.3)
SODIUM: 136 meq/L — AB (ref 137–147)

## 2014-07-02 LAB — GLUCOSE, CAPILLARY
Glucose-Capillary: 85 mg/dL (ref 70–99)
Glucose-Capillary: 83 mg/dL (ref 70–99)
Glucose-Capillary: 94 mg/dL (ref 70–99)

## 2014-07-02 LAB — HEPARIN LEVEL (UNFRACTIONATED)
HEPARIN UNFRACTIONATED: 0.58 [IU]/mL (ref 0.30–0.70)
Heparin Unfractionated: 0.26 IU/mL — ABNORMAL LOW (ref 0.30–0.70)
Heparin Unfractionated: 0.21 IU/mL — ABNORMAL LOW (ref 0.30–0.70)

## 2014-07-02 LAB — PHOSPHORUS: Phosphorus: 2 mg/dL — ABNORMAL LOW (ref 2.3–4.6)

## 2014-07-02 LAB — MAGNESIUM: Magnesium: 1.6 mg/dL (ref 1.5–2.5)

## 2014-07-02 MED ORDER — LEVETIRACETAM IN NACL 1000 MG/100ML IV SOLN
1000.0000 mg | Freq: Once | INTRAVENOUS | Status: AC
  Administered 2014-07-02: 1000 mg via INTRAVENOUS
  Filled 2014-07-02: qty 100

## 2014-07-02 MED ORDER — DEXTROSE 5 % IV SOLN
1.0000 g | INTRAVENOUS | Status: DC
  Administered 2014-07-02 – 2014-07-04 (×3): 1 g via INTRAVENOUS
  Filled 2014-07-02 (×4): qty 1

## 2014-07-02 MED ORDER — LEVETIRACETAM IN NACL 500 MG/100ML IV SOLN
500.0000 mg | Freq: Two times a day (BID) | INTRAVENOUS | Status: DC
  Administered 2014-07-02 – 2014-07-03 (×2): 500 mg via INTRAVENOUS
  Filled 2014-07-02 (×2): qty 100

## 2014-07-02 MED ORDER — HEPARIN (PORCINE) IN NACL 100-0.45 UNIT/ML-% IJ SOLN
1550.0000 [IU]/h | INTRAMUSCULAR | Status: DC
  Administered 2014-07-02 – 2014-07-03 (×4): 1550 [IU]/h via INTRAVENOUS
  Filled 2014-07-02 (×5): qty 250

## 2014-07-02 MED ORDER — METOPROLOL TARTRATE 12.5 MG HALF TABLET
12.5000 mg | ORAL_TABLET | Freq: Two times a day (BID) | ORAL | Status: DC
  Administered 2014-07-02 – 2014-07-04 (×5): 12.5 mg via ORAL
  Filled 2014-07-02 (×6): qty 1

## 2014-07-02 MED ORDER — VANCOMYCIN HCL IN DEXTROSE 750-5 MG/150ML-% IV SOLN
750.0000 mg | Freq: Two times a day (BID) | INTRAVENOUS | Status: DC
  Administered 2014-07-02 (×2): 750 mg via INTRAVENOUS
  Filled 2014-07-02 (×5): qty 150

## 2014-07-02 NOTE — Progress Notes (Signed)
PULMONARY / CRITICAL CARE MEDICINE   Name: Bryan Long MRN: [id number] DOB: [birth year]    ADMISSION DATE:  2014/03/18  REFERRING MD :  EDP   CHIEF COMPLAINT:  Cardiac arrest  INITIAL PRESENTATION: 80 yo admiited 7/23 after VF arrest at home.  Approximate 15 minute downtime. Intubated in field, required shock x 3 + ACLS.  STUDIES / EVENTS:  7/23  CT head:  neg 7/23  Cardiac Cath: severe multivessel CAD 7/23  Hypothermia started 7/24  TTE EF 30-35%  7/24 PM - rewarmed 7/25 vent dyssynchrony. Propofol initiated 7/26 Tolerates PS 5-10 cm H2O. Cognition prohibits extubation. RASS -3. Not F/C.  7/26 CT head: NAICP 7/26 CT neck: spondylosis, osteoarthritis, carotid calcifications. No subluxations  LINES: ETT  7/23 >>  L IJ CVL 7/23 >>  R radial art-line 7/23 >>    CULTURES: Urine 7/23 >> NEG Resp 7/26 >> GPC in pairs, mod GNR  ANTIBIOTICS: Levofloxacin 7/26 >> 7/27 Vanc 7/27 >> Cefepime 7/27 >>  INTERVAL HISTORY:  Shivering noted while normothermic, not following commands  VITAL SIGNS: Temp:  [97 F (36.1 C)-99 F (37.2 C)] 98.6 F (37 C) (07/27 0800) Pulse Rate:  [73-107] 90 (07/27 0800) Resp:  [15-35] 18 (07/27 0800) BP: (127-145)/(41-90) 130/90 mmHg (07/27 0800) SpO2:  [97 %-100 %] 100 % (07/27 0800) Arterial Line BP: (107-148)/(40-51) 135/46 mmHg (07/27 0800) FiO2 (%):  [40 %] 40 % (07/27 0800)  HEMODYNAMICS:    VENTILATOR SETTINGS: Vent Mode:  [-] PRVC FiO2 (%):  [40 %] 40 % Set Rate:  [14 bmp] 14 bmp Vt Set:  [580 mL] 580 mL PEEP:  [5 cmH20] 5 cmH20 Pressure Support:  [14 cmH20] 14 cmH20 Plateau Pressure:  [17 cmH20-19 cmH20] 19 cmH20  INTAKE / OUTPUT: Intake/Output     07/26 0701 - 07/27 0700 07/27 0701 - 07/28 0700   I.V. (mL/kg) 1674.8 (23.9) 59.2 (0.8)   NG/GT 1500 50   IV Piggyback 100    Total Intake(mL/kg) 3274.8 (46.8) 109.2 (1.6)   Urine (mL/kg/hr) 1855 (1.1) 225 (1.8)   Stool 2 (0)    Total Output 1857 225   Net +1417.8 -115.8         PHYSICAL EXAMINATION:  Gen; sedated on vent HEENT: NCAT, ETT in place PULM: few crackles bilaterally CV: RRR, difficult exam due to pads AB: limited by pads Ext: cool, no edema Neuro: sedated on vent, withdraws to pain in all four ext, does not follow commands  LABS: I have reviewed all of today's lab results. Relevant abnormalities are discussed in the A/P section  7/27 CXR: reviewed by me, bibasilar infiltrates R > L, ETT in place  ASSESSMENT / PLAN:  PULMONARY A: Acute respiratory failure in setting of cardiac arrest HCAP P:  Cont full vent support - settings reviewed and/or adjusted Cont vent bundle Daily SBT if/when meets criteria See ID  CARDIOVASCULAR A:  S/P VF arrest 7/23 NSTEMI Severe multivessel CAD Cardiomyopathy (LVEF 30-35%) Mild hypotension, resolved P:  Cont ASA, statin Heparin per cardiology Cards following  RENAL A:   AKI, nonoliguric > improving P:   Monitor BMET intermittently Monitor I/Os Correct electrolytes as indicated  GASTROINTESTINAL A:   Mildly elevated LFTs P:   SUP: enteral PPI Cont TFs Monitor LFTs intermittently  HEMATOLOGIC A:   Mild anemia without acute blood loss P:  DVT px: full dose heparin Monitor CBC intermittently Transfuse per usual ICU guidelines  INFECTIOUS A:   HCAP P:   D/c levaquin Start vanc/cefepime  ENDOCRINE A:   Hyperglycemia resolved P:   CBGs q 8 hrs Resume SSI for glu > 180  NEUROLOGIC A:   Post anoxic acute encephalopathy > slow to improve Cervical collar in place> wife witnessed fall, apparently soft fall, did not strike head; CT neck normal P:   MRI brain EEG Neurology consult 7/28 RASS goal -1 Propofol as needed Cont PRN fent Daily WUA D/c hard collar  I have personally obtained history, examined patient, evaluated and interpreted laboratory and imaging results, reviewed medical records, formulated assessment / plan and placed orders.  CRITICAL CARE:  The  patient is critically ill with multiple organ systems failure and requires high complexity decision making for assessment and support, frequent evaluation and titration of therapies, application of advanced monitoring technologies and extensive interpretation of multiple databases. Critical Care Time devoted to patient care services described in this note is 35 minutes.   Family updated in detail  Heber Winger, MD [place] [facility] Pager: [phone] Cell: [phone] If no response, call [phone]   07/02/2014, 8:49 AM

## 2014-07-02 NOTE — Progress Notes (Signed)
Routine EEG started, changed to LTM after Dr Roseanne Reno viewed tracing.

## 2014-07-02 NOTE — Progress Notes (Addendum)
ANTICOAGULATION CONSULT NOTE - Follow Up Consult  Pharmacy Consult for Heparin  Indication: chest pain/ACS  No Known Allergies  Patient Measurements: Height: 5\' 9"  (175.3 cm) Weight: 154 lb 5.2 oz (70 kg) IBW/kg (Calculated) : 70.7  Vital Signs: Temp: 98.1 F (36.7 C) (07/27 0000) Temp src: Oral (07/27 0000) BP: 134/43 mmHg (07/26 2106) Pulse Rate: 98 (07/26 2008)  Labs:  Recent Labs  06/29/14 0400  06/29/14 1200  06/30/14 06/30/14 0400 07/01/14 0315 07/01/14 1500 07/02/14 0110  HGB 12.3*  --   --   --   --  12.2* 10.7*  --   --   HCT 36.8*  --   --   --   --  35.4* 32.2*  --   --   PLT 231  --   --   --   --  224 196  --   --   HEPARINUNFRC 0.25*  --   --   < > 0.46 0.40 0.19* 0.23* 0.26*  CREATININE 0.99  < >  --   < > 1.19 1.33 1.81*  --   --   TROPONINI  --   --  5.29*  --   --   --   --   --   --   < > = values in this interval not displayed.  Estimated Creatinine Clearance: 30.6 ml/min (by C-G formula based on Cr of 1.81).   Medications:  Heparin 1100 units/hr  Assessment: 80 y/o M on s/p arctic sun protocol--has now been re-warmed, HL is sub-therapeutic at 0.26, other labs as above.  Goal of Therapy:  Heparin level 0.3-0.7 units/ml Monitor platelets by anticoagulation protocol: Yes   Plan:  -Increase heparin to 1250 units/hr -1030 HL -Daily CBC/HL -Monitor for bleeding  Abran Duke 07/02/2014,1:58 AM  ======================================= Addendum 5:48 AM Undetectable HL drawn ~2 hours after rate change, normally wouldn't adjust rate after 2 hours but given that HL went from 0.23 to undetectable with NO issues reported by RN, will increase rate some.  -Increase heparin to 1400 units/hr -1330 HL JLedford, PharmD

## 2014-07-02 NOTE — Progress Notes (Signed)
ANTICOAGULATION CONSULT NOTE - Follow Up Consult  Pharmacy Consult for Heparin  Indication: chest pain/ACS  No Known Allergies  Patient Measurements: Height: 5\' 9"  (175.3 cm) Weight: 154 lb 5.2 oz (70 kg) IBW/kg (Calculated) : 70.7  Vital Signs: Temp: 98.2 F (36.8 C) (07/27 1200) Temp src: Core (Comment) (07/27 0800) BP: 115/35 mmHg (07/27 1211) Pulse Rate: 76 (07/27 1211)  Labs:  Recent Labs  06/30/14 0400 07/01/14 0315  07/02/14 0110 07/02/14 0400 07/02/14 1300  HGB 12.2* 10.7*  --   --  9.5*  --   HCT 35.4* 32.2*  --   --  28.3*  --   PLT 224 196  --   --  157  --   HEPARINUNFRC 0.40 0.19*  < > 0.26* <0.10* 0.21*  CREATININE 1.33 1.81*  --   --  1.31  --   < > = values in this interval not displayed.  Estimated Creatinine Clearance: 42.3 ml/min (by C-G formula based on Cr of 1.31).   Medications:  . sodium chloride Stopped (07/02/14 0600)  . sodium chloride 500 mL (06/30/14 1446)  . sodium chloride Stopped (07/01/14 0000)  . dextrose 25 mL/hr at 07/02/14 0700  . dextrose 5 % and 0.45% NaCl Stopped (07/02/14 1041)  . feeding supplement (VITAL AF 1.2 CAL) 1,000 mL (07/01/14 1900)  . heparin    . norepinephrine (LEVOPHED) Adult infusion Stopped (07/01/14 0900)  . propofol 30 mcg/kg/min (07/02/14 0800)    Assessment: 80 y/o M on s/p arctic sun protocol--has now been re-warmed, HL is sub-therapeutic at 0.21 despite earlier rate increases, Hgb and Pltc drifting down.    Goal of Therapy:  Heparin level 0.3-0.7 units/ml Monitor platelets by anticoagulation protocol: Yes   Plan:  -Increase heparin to 1550 units/hr -2300 HL -Daily CBC/HL -Monitor for bleeding  Tad Moore, BCPS  Clinical Pharmacist Pager [phone]  07/02/2014 2:30 PM

## 2014-07-02 NOTE — Progress Notes (Signed)
ANTICOAGULATION CONSULT NOTE Pharmacy Consult for Heparin  Indication: chest pain/ACS  No Known Allergies  Patient Measurements: Height: 5\' 9"  (175.3 cm) Weight: 154 lb 5.2 oz (70 kg) IBW/kg (Calculated) : 70.7  Vital Signs: Temp: 98.6 F (37 C) (07/27 2200) Temp src: Core (Comment) (07/27 2000) BP: 103/39 mmHg (07/27 2200) Pulse Rate: 81 (07/27 2155)  Labs:  Recent Labs  06/30/14 0400 07/01/14 0315  07/02/14 0400 07/02/14 1300 07/02/14 2210  HGB 12.2* 10.7*  --  9.5*  --   --   HCT 35.4* 32.2*  --  28.3*  --   --   PLT 224 196  --  157  --   --   HEPARINUNFRC 0.40 0.19*  < > <0.10* 0.21* 0.58  CREATININE 1.33 1.81*  --  1.31  --   --   < > = values in this interval not displayed.  Estimated Creatinine Clearance: 42.3 ml/min (by C-G formula based on Cr of 1.31).  Assessment: 80 y/o Male s/p cardiac arrest and hypothermia protocol, awaiting possible CABG, for heparin    Goal of Therapy:  Heparin level 0.3-0.7 units/ml Monitor platelets by anticoagulation protocol: Yes   Plan:  Continue Heparin at current rate  Follow-up am labs.  Geannie Risen, PharmD, BCPS  07/02/2014 10:57 PM

## 2014-07-02 NOTE — Progress Notes (Signed)
ANTIBIOTIC CONSULT NOTE - INITIAL  Pharmacy Consult for Vancomycin and Cefepime Indication: pneumonia  No Known Allergies  Patient Measurements: Height: 5\' 9"  (175.3 cm) Weight: 154 lb 5.2 oz (70 kg) IBW/kg (Calculated) : 70.7 Adjusted Body Weight: n/a  Vital Signs: Temp: 98.6 F (37 C) (07/27 0800) Temp src: Core (Comment) (07/27 0800) BP: 145/48 mmHg (07/27 0857) Pulse Rate: 105 (07/27 0857) Intake/Output from previous day: 07/26 0701 - 07/27 0700 In: 3274.8 [I.V.:1674.8; NG/GT:1500; IV Piggyback:100] Out: 1857 [Urine:1855; Stool:2] Intake/Output from this shift: Total I/O In: 109.2 [I.V.:59.2; NG/GT:50] Out: 225 [Urine:225]  Labs:  Recent Labs  06/30/14 0400 07/01/14 0315 07/02/14 0400  WBC 15.1* 16.0* 10.9*  HGB 12.2* 10.7* 9.5*  PLT 224 196 157  CREATININE 1.33 1.81* 1.31   Estimated Creatinine Clearance: 42.3 ml/min (by C-G formula based on Cr of 1.31). No results found for this basename: Rolm GalaVANCOTROUGH, VANCOPEAK, VANCORANDOM, GENTTROUGH, GENTPEAK, GENTRANDOM, TOBRATROUGH, TOBRAPEAK, TOBRARND, AMIKACINPEAK, AMIKACINTROU, AMIKACIN,  in the last 72 hours   Microbiology: Recent Results (from the past 720 hour(s))  URINE CULTURE     Status: None   Collection Time    06/16/2014 12:05 PM      Result Value Ref Range Status   Specimen Description URINE, CATHETERIZED   Final   Special Requests NONE   Final   Culture  Setup Time     Final   Value: 06/16/2014 18:29     Performed at [facility]   Colony Count     Final   Value: NO GROWTH     Performed at [facility]   Culture     Final   Value: NO GROWTH     Performed at [facility]   Report Status 06/29/2014 FINAL   Final  MRSA PCR SCREENING     Status: None   Collection Time    06/13/2014  5:20 PM      Result Value Ref Range Status   MRSA by PCR NEGATIVE  NEGATIVE Final   Comment:            The GeneXpert MRSA Assay (FDA     approved for NASAL specimens     only), is one component  of a     comprehensive MRSA colonization     surveillance program. It is not     intended to diagnose MRSA     infection nor to guide or     monitor treatment for     MRSA infections.  CULTURE, RESPIRATORY (NON-EXPECTORATED)     Status: None   Collection Time    07/01/14 10:50 AM      Result Value Ref Range Status   Specimen Description TRACHEAL ASPIRATE   Final   Special Requests NONE   Final   Gram Stain     Final   Value: FEW WBC PRESENT, PREDOMINANTLY PMN     RARE SQUAMOUS EPITHELIAL CELLS PRESENT     MODERATE GRAM POSITIVE RODS     ABUNDANT GRAM POSITIVE COCCI IN PAIRS     IN CHAINS IN CLUSTERS MODERATE GRAM NEGATIVE RODS   Culture PENDING   Incomplete   Report Status PENDING   Incomplete    Medical History: Past Medical History  Diagnosis Date  . Hyperlipidemia   . Peripheral vascular disease     Medications:  Scheduled:  . antiseptic oral rinse  15 mL Mouth Rinse QID  . aspirin  81 mg Per Tube Daily  . ceFEPime (MAXIPIME) IV  1 g Intravenous Q24H  . chlorhexidine  15 mL Mouth Rinse BID  . free water  200 mL Per Tube 3 times per day  . metoprolol tartrate  12.5 mg Oral BID  . pantoprazole sodium  40 mg Per Tube Q1200  . sodium chloride  3 mL Intravenous Q12H  . vancomycin  750 mg Intravenous Q12H   Assessment: 80 yo male admitted after VF arrest at home.  S/p hypothermia protocol.  Pharmacy asked to change Levaquin to vancomycin and cefepime today for suspected HCAP.  Cultures pending.  Scr 1.31 today, CrCl ~ 42 ml/min  Goal of Therapy:  Vancomycin trough level 15-20 mcg/ml  Plan:  1. Vancomycin 750 mg IV q 12 hrs. 2. Cefepime 1g IV q 24 hrs. 3. Will watch renal function and adjust antibiotics as needed.

## 2014-07-02 NOTE — Progress Notes (Signed)
Subjective:  Events of weekend reviewed.  Intubated and sedated.  No progress on neuro at present.  Remains intubated.  Objective:  Vital Signs in the last 24 hours: BP 145/48  Pulse 105  Temp(Src) 98.6 F (37 C) (Core (Comment))  Resp 18  Ht 5\' 9"  (1.753 m)  Wt 70 kg (154 lb 5.2 oz)  BMI 22.78 kg/m2  SpO2 100%  Physical Exam: Elderly WM intubated, sedated, not responsive Lungs:  Clear  Cardiac:  Regular rhythm, normal S1 and S2, no S3 3/6 systolic murmur Extremities:  No edema present, cool  Intake/Output from previous day: 07/26 0701 - 07/27 0700 In: 3274.8 [I.V.:1674.8; NG/GT:1500; IV Piggyback:100] Out: 1857 [Urine:1855; Stool:2] Weight Filed Weights   Jul 08, 2014 1200 07/01/14 0500  Weight: 68.04 kg (150 lb) 70 kg (154 lb 5.2 oz)    Lab Results: Basic Metabolic Panel:  Recent Labs  24/58/09 0315 07/02/14 0400  NA 138 136*  K 4.7 4.1  CL 104 106  CO2 17* 17*  GLUCOSE 106* 121*  BUN 27* 33*  CREATININE 1.81* 1.31    CBC:  Recent Labs  07/01/14 0315 07/02/14 0400  WBC 16.0* 10.9*  HGB 10.7* 9.5*  HCT 32.2* 28.3*  MCV 90.2 88.7  PLT 196 157    BNP    Component Value Date/Time   PROBNP 4283.0* 2014/07/08 1200    PROTIME: Lab Results  Component Value Date   INR 1.33 07-08-14   INR 1.33 Jul 08, 2014   INR 1.0 05/26/2007    Telemetry:  sinus  Assessment/Plan:  1. Out of hospital cardiac arrest 2. Severe AS, severe 3VD 3. Carotid artery disease with 90% LICA stenosis 4. ARF due to cardiac arrest  Rec:  Awaiting to see if will regain cognitive function.  Appreciate care over weekend.     Darden Palmer  MD [facility]  07/02/2014, 9:23 AM

## 2014-07-02 NOTE — Consult Note (Signed)
NEURO HOSPITALIST CONSULT NOTE    Reason for Consult: Encephalopathy and status epilepticus  HPI:                                                                                                                                          Bryan Long is an 80 y.o. male  Wife reports patient was outside mowing the lawn when she heard him collapse. She attempted to arouse him and tried to perform CPR. She then called EMS. Patient had an approximate 15 minute total downtime. Fire arrived on scene and patient was pulseless with no respirations. He was intubated in the field, required shock x3 + meds. He was also given Narcan in the field without response. Wife reports he was in his usual state of health without complaints as of 7/22. ER evaluation noted the patient to be obtunded without sedation. CT of the head was negative for acute bleed. He was taken directly to the cath lab from ER for evaluation. Patient was noted to have severe multivessel disease and returned to ICU. Placed on [place] sun on 7/23 and rewarmed on 06/30/14 at 0300. EEG was obtained due to continued encephalopathy. EEG showed on going seizure and patient was ordered Keppra 1000 mg and propofol was increased.     Rewarmed 06/30/14 at 0300 WBC 10.9 AST 120 ALT 98  Currently on:  Vanc and Cefepime Propofol Levophed Fentanyl PRN  Past Medical History  Diagnosis Date  . Hyperlipidemia   . Peripheral vascular disease     Past Surgical History  Procedure Laterality Date  . Carotid endarterectomy      Family History: Unable to obtain  Social History:  reports that he has never smoked. He does not have any smokeless tobacco history on file. His alcohol and drug histories are not on file.  No Known Allergies  MEDICATIONS:                                                                                                                     Prior to Admission:  Prescriptions prior to admission  Medication  Sig Dispense Refill  . atorvastatin (LIPITOR) 20 MG tablet Take 20 mg by mouth daily.       Scheduled: . antiseptic oral rinse  15 mL  Mouth Rinse QID  . aspirin  81 mg Per Tube Daily  . ceFEPime (MAXIPIME) IV  1 g Intravenous Q24H  . chlorhexidine  15 mL Mouth Rinse BID  . free water  200 mL Per Tube 3 times per day  . levETIRAcetam  1,000 mg Intravenous Once  . metoprolol tartrate  12.5 mg Oral BID  . pantoprazole sodium  40 mg Per Tube Q1200  . sodium chloride  3 mL Intravenous Q12H  . vancomycin  750 mg Intravenous Q12H     ROS:                                                                                                                                       History obtained from unobtainable from patient due to mental status    Blood pressure 115/35, pulse 76, temperature 98.2 F (36.8 C), temperature source Core (Comment), resp. rate 17, height 5\' 9"  (1.753 m), weight 70 kg (154 lb 5.2 oz), SpO2 100.00%.   Neurologic Examination:                                                                                                      Mental Status: Patient is intubated does not respond to verbal stimuli.  Does not respond to deep sternal rub.  Does not follow commands.  No verbalizations noted.  Cranial Nerves: II: Patient does not respond confrontation bilaterally, pupils right 2 mm, left 2 mm,and reactive bilaterally III,IV,VI: doll's response absent bilaterally.  V,VII: corneal reflex absent bilaterally  VIII: patient does not respond to verbal stimuli IX,X: gag reflex present, XI: trapezius strength unable to test bilaterally XII: tongue strength unable to test Motor: Extremities flaccid throughout.  No spontaneous movement noted.  No purposeful movements noted. Intermittent bilateral shrugging of shoulders and high frequency tremor like activity bilaterally  Sensory: Does not respond to noxious stimuli in any extremity. Deep Tendon Reflexes:  Absent  throughout. Plantars: absent bilaterally Cerebellar: Unable to perform    Lab Results: Basic Metabolic Panel:  Recent Labs Lab 06/29/14 2000 06/30/14 06/30/14 0400 07/01/14 0315 07/02/14 0400  NA 138 137 138 138 136*  K 3.7 3.9 4.1 4.7 4.1  CL 107 108 105 104 106  CO2 17* 16* 17* 17* 17*  GLUCOSE 118* 96 102* 106* 121*  BUN 19 19 19  27* 33*  CREATININE 1.08 1.19 1.33 1.81* 1.31  CALCIUM 6.9* 7.0* 7.1* 7.0* 7.3*    Liver Function Tests:  Recent Labs Lab 06/23/2014 1238  07/01/14 0315  AST 175* 120*  ALT 150* 98*  ALKPHOS 83 72  BILITOT 0.8 0.5  PROT 6.0 5.7*  ALBUMIN 2.8* 2.5*   No results found for this basename: LIPASE, AMYLASE,  in the last 168 hours No results found for this basename: AMMONIA,  in the last 168 hours  CBC:  Recent Labs Lab 2014-09-29 1200  2014-09-29 2016 06/29/14 0400 06/30/14 0400 07/01/14 0315 07/02/14 0400  WBC 17.3*  --   --  15.6* 15.1* 16.0* 10.9*  NEUTROABS 10.0*  --   --   --   --   --   --   HGB 10.5*  < > 14.3 12.3* 12.2* 10.7* 9.5*  HCT 32.3*  < > 42.0 36.8* 35.4* 32.2* 28.3*  MCV 92.6  --   --  88.7 88.1 90.2 88.7  PLT 202  --   --  231 224 196 157  < > = values in this interval not displayed.  Cardiac Enzymes:  Recent Labs Lab 2014-09-29 1200 06/29/14 1200  TROPONINI 0.38* 5.29*    Lipid Panel:  Recent Labs Lab 06/30/14 0637  TRIG 78    CBG:  Recent Labs Lab 07/01/14 0754 07/01/14 1205 07/01/14 1605 07/02/14 0018 07/02/14 0731  GLUCAP 74 91 80 85 83    Microbiology: Results for orders placed during the hospital encounter of 2014-09-29  URINE CULTURE     Status: None   Collection Time    2014-09-29 12:05 PM      Result Value Ref Range Status   Specimen Description URINE, CATHETERIZED   Final   Special Requests NONE   Final   Culture  Setup Time     Final   Value: 10/08/2014 18:29     Performed at [facility]   Colony Count     Final   Value: NO GROWTH     Performed at [facility]   Culture     Final   Value: NO GROWTH     Performed at [facility]   Report Status 06/29/2014 FINAL   Final  MRSA PCR SCREENING     Status: None   Collection Time    2014-09-29  5:20 PM      Result Value Ref Range Status   MRSA by PCR NEGATIVE  NEGATIVE Final   Comment:            The GeneXpert MRSA Assay (FDA     approved for NASAL specimens     only), is one component of a     comprehensive MRSA colonization     surveillance program. It is not     intended to diagnose MRSA     infection nor to guide or     monitor treatment for     MRSA infections.  CULTURE, RESPIRATORY (NON-EXPECTORATED)     Status: None   Collection Time    07/01/14 10:50 AM      Result Value Ref Range Status   Specimen Description TRACHEAL ASPIRATE   Final   Special Requests NONE   Final   Gram Stain     Final   Value: FEW WBC PRESENT, PREDOMINANTLY PMN     RARE SQUAMOUS EPITHELIAL CELLS PRESENT     MODERATE GRAM POSITIVE RODS     ABUNDANT GRAM POSITIVE COCCI IN PAIRS     IN CHAINS IN CLUSTERS MODERATE GRAM NEGATIVE RODS   Culture     Final   Value: Culture reincubated for  better growth     Performed at [facility]   Report Status PENDING   Incomplete    Coagulation Studies: No results found for this basename: LABPROT, INR,  in the last 72 hours  Imaging: Ct Head Wo Contrast  07/01/2014   CLINICAL DATA:  Pain post trauma ; status post cardiac arrest  EXAM: CT HEAD WITHOUT CONTRAST  CT CERVICAL SPINE WITHOUT CONTRAST  TECHNIQUE: Multidetector CT imaging of the head and cervical spine was performed following the standard protocol without intravenous contrast. Multiplanar CT image reconstructions of the cervical spine were also generated.  COMPARISON:  Head CT June 28, 2014  FINDINGS: CT HEAD FINDINGS  Mild diffuse atrophy is stable. Prominence of the cisterna magna is an anatomic variant. There is no mass, hemorrhage, extra-axial fluid collection, or midline shift. Mild  periventricular small vessel disease in the centra semiovale bilaterally is stable. There is no new gray-white compartment lesion. No acute infarct or brain edema apparent. Bony calvarium appears intact. The mastoid air cells are clear.  CT CERVICAL SPINE FINDINGS  There is no fracture or spondylolisthesis. Prevertebral soft tissues and predental space regions are normal. There is fairly marked disc space narrowing at C6-7 and C7-T1. There is moderate disc space narrowing at C3-4, C4-5, and C5-6. There is bony hypertrophy at most levels bilaterally. Exit foraminal narrowing is rather marked at C3-4 on the left, C4-5 bilaterally, C5-6 on the right, C6-7 on the left, and C7-T1 on the left. There is no frank disc extrusion or stenosis. There is calcification in both carotid arteries, more severe on the left than on the right. Patient is intubated. There is also a nasogastric tube in the visualize esophagus.  IMPRESSION: CT head: Mild atrophy with mild small vessel disease, stable. No intracranial mass, hemorrhage, or acute appearing infarct.  CT cervical spine: Multifocal spondylosis and osteoarthritis. No fracture or spondylolisthesis. Carotid artery calcification, much more severe on the left than on the right.   Electronically Signed   By: Bretta Bang M.D.   On: 07/01/2014 14:53   Ct Cervical Spine Wo Contrast  07/01/2014   CLINICAL DATA:  Pain post trauma ; status post cardiac arrest  EXAM: CT HEAD WITHOUT CONTRAST  CT CERVICAL SPINE WITHOUT CONTRAST  TECHNIQUE: Multidetector CT imaging of the head and cervical spine was performed following the standard protocol without intravenous contrast. Multiplanar CT image reconstructions of the cervical spine were also generated.  COMPARISON:  Head CT June 28, 2014  FINDINGS: CT HEAD FINDINGS  Mild diffuse atrophy is stable. Prominence of the cisterna magna is an anatomic variant. There is no mass, hemorrhage, extra-axial fluid collection, or midline shift. Mild  periventricular small vessel disease in the centra semiovale bilaterally is stable. There is no new gray-white compartment lesion. No acute infarct or brain edema apparent. Bony calvarium appears intact. The mastoid air cells are clear.  CT CERVICAL SPINE FINDINGS  There is no fracture or spondylolisthesis. Prevertebral soft tissues and predental space regions are normal. There is fairly marked disc space narrowing at C6-7 and C7-T1. There is moderate disc space narrowing at C3-4, C4-5, and C5-6. There is bony hypertrophy at most levels bilaterally. Exit foraminal narrowing is rather marked at C3-4 on the left, C4-5 bilaterally, C5-6 on the right, C6-7 on the left, and C7-T1 on the left. There is no frank disc extrusion or stenosis. There is calcification in both carotid arteries, more severe on the left than on the right. Patient is intubated. There is  also a nasogastric tube in the visualize esophagus.  IMPRESSION: CT head: Mild atrophy with mild small vessel disease, stable. No intracranial mass, hemorrhage, or acute appearing infarct.  CT cervical spine: Multifocal spondylosis and osteoarthritis. No fracture or spondylolisthesis. Carotid artery calcification, much more severe on the left than on the right.   Electronically Signed   By: Bretta Bang M.D.   On: 07/01/2014 14:53   Dg Chest Port 1 View  07/02/2014   CLINICAL DATA:  Respiratory failure.  EXAM: PORTABLE CHEST - 1 VIEW  COMPARISON:  07/01/2014.  FINDINGS: Endotracheal tube, left IJ line, NG tube in stable position. Interim slight progression of bilateral pulmonary infiltrates. Persistent cardiomegaly with mild pulmonary venous congestion. These findings suggest congestive heart failure with slight increased pulmonary edema. Superimposed basilar pneumonia cannot be excluded.  IMPRESSION: 1. Line and tube positions stable. 2. Cardiomegaly with slight progression of bilateral pulmonary alveolar infiltrates suggesting slight worsening of pulmonary  edema.   Electronically Signed   By: Maisie Fus  Register   On: 07/02/2014 07:34   Dg Chest Port 1 View  07/01/2014   CLINICAL DATA:  f/u respiratory failure  EXAM: PORTABLE CHEST - 1 VIEW  COMPARISON:  07/05/2014  FINDINGS: Endotracheal tube, left IJ central line, and nasogastric tube stable in position. Heart size upper limits normal. Some interval improvement in the diffuse interstitial edema with some residual pulmonary vascular congestion. There are new patchy airspace opacities in the lung bases right greater than left. Suspect small right pleural effusion. Degenerative changes in the right shoulder noted  IMPRESSION: 1. Improving interstitial edema with increasing asymmetric basilar airspace disease right greater than left. 2.  Support hardware stable in position.   Electronically Signed   By: Oley Balm M.D.   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 Patient's prognosis is guarded at best at this point.  Plan: 1. Keppra 1000 mg IV followed by 500 mg every 12 hours 2. We'll increase propofol and an effort to control status epilepticus, in addition to continuing Keppra. 3. We'll also continue long-term EEG monitoring for seizure management.  We will continue to follow this patient closely with you.   I personally participate in this patient's evaluation and management, including formulating the above clinical impression and management recommendations.  Venetia Maxon M.D.  [facility]  [phone]

## 2014-07-03 DIAGNOSIS — G931 Anoxic brain damage, not elsewhere classified: Secondary | ICD-10-CM

## 2014-07-03 DIAGNOSIS — G40401 Other generalized epilepsy and epileptic syndromes, not intractable, with status epilepticus: Secondary | ICD-10-CM

## 2014-07-03 LAB — CBC
HEMATOCRIT: 27.3 % — AB (ref 39.0–52.0)
HEMOGLOBIN: 9.2 g/dL — AB (ref 13.0–17.0)
MCH: 29.9 pg (ref 26.0–34.0)
MCHC: 33.7 g/dL (ref 30.0–36.0)
MCV: 88.6 fL (ref 78.0–100.0)
Platelets: 156 10*3/uL (ref 150–400)
RBC: 3.08 MIL/uL — AB (ref 4.22–5.81)
RDW: 17.1 % — ABNORMAL HIGH (ref 11.5–15.5)
WBC: 8 10*3/uL (ref 4.0–10.5)

## 2014-07-03 LAB — GLUCOSE, CAPILLARY
GLUCOSE-CAPILLARY: 89 mg/dL (ref 70–99)
GLUCOSE-CAPILLARY: 108 mg/dL — AB (ref 70–99)
Glucose-Capillary: 116 mg/dL — ABNORMAL HIGH (ref 70–99)
Glucose-Capillary: 90 mg/dL (ref 70–99)

## 2014-07-03 LAB — CULTURE, RESPIRATORY W GRAM STAIN

## 2014-07-03 LAB — BASIC METABOLIC PANEL
Anion gap: 10 (ref 5–15)
BUN: 28 mg/dL — ABNORMAL HIGH (ref 6–23)
CHLORIDE: 107 meq/L (ref 96–112)
CO2: 20 meq/L (ref 19–32)
Calcium: 7.6 mg/dL — ABNORMAL LOW (ref 8.4–10.5)
Creatinine, Ser: 1.12 mg/dL (ref 0.50–1.35)
GFR calc Af Amer: 68 mL/min — ABNORMAL LOW (ref >90)
GFR calc non Af Amer: 59 mL/min — ABNORMAL LOW (ref >90)
Glucose, Bld: 112 mg/dL — ABNORMAL HIGH (ref 70–99)
Potassium: 3.9 mEq/L (ref 3.7–5.3)
Sodium: 137 mEq/L (ref 137–147)

## 2014-07-03 LAB — TRIGLYCERIDES: TRIGLYCERIDES: 73 mg/dL (ref <150)

## 2014-07-03 LAB — CULTURE, RESPIRATORY

## 2014-07-03 LAB — HEPARIN LEVEL (UNFRACTIONATED): HEPARIN UNFRACTIONATED: 0.52 [IU]/mL (ref 0.30–0.70)

## 2014-07-03 MED ORDER — LACOSAMIDE 200 MG/20ML IV SOLN
100.0000 mg | Freq: Two times a day (BID) | INTRAVENOUS | Status: DC
  Filled 2014-07-03: qty 10

## 2014-07-03 MED ORDER — LEVETIRACETAM IN NACL 1500 MG/100ML IV SOLN
1500.0000 mg | Freq: Two times a day (BID) | INTRAVENOUS | Status: DC
  Administered 2014-07-03 – 2014-07-04 (×2): 1500 mg via INTRAVENOUS
  Filled 2014-07-03 (×3): qty 100

## 2014-07-03 MED ORDER — LEVETIRACETAM IN NACL 1000 MG/100ML IV SOLN
1000.0000 mg | Freq: Two times a day (BID) | INTRAVENOUS | Status: DC
  Administered 2014-07-03: 1000 mg via INTRAVENOUS
  Filled 2014-07-03 (×2): qty 100

## 2014-07-03 MED ORDER — SODIUM CHLORIDE 0.9 % IV SOLN
200.0000 mg | Freq: Once | INTRAVENOUS | Status: AC
  Administered 2014-07-03: 200 mg via INTRAVENOUS
  Filled 2014-07-03: qty 20

## 2014-07-03 MED ORDER — SODIUM CHLORIDE 0.9 % IV SOLN
200.0000 mg | Freq: Two times a day (BID) | INTRAVENOUS | Status: DC
  Administered 2014-07-03 – 2014-07-04 (×2): 200 mg via INTRAVENOUS
  Filled 2014-07-03 (×4): qty 20

## 2014-07-03 NOTE — Procedures (Addendum)
Electroencephalogram report- LTM   Ordering Physician : DR Roseanne RenoStewart  EEG number:  [id number]    Data acquisition: 10-20 electrode placement.  Additional T1, T2, and EKG electrodes; 26 channel digital referential acquisition reformatted to 18 channel/7 channel coronal bipolar     Spike detection: ON     Seizure detection: ON   Beginning time: 07/02/14 Ending time: 07/03/14  Day of study: day 1    This 24 hours of intensive EEG monitoring with simultaneous video monitoring was performed for this patient with   Status post cardiac arrest at home.  found unresponsive.  Patient is now intubated and sedated with propofol   Medications: propofol.  Keppra,   Background activity is marked  by absence of normal activities of wakefulness or sleep.  As recording  begins background activities are marked  by a low amplitude predominantly delta slowing with superimposed generalized anteriorly predominant sharp waves evolving in morphology,  frequency and amplitude and  consistent with non convulsive status epilepticus.  During the first hours this ictal pattern. Becomes Less prominent and eventually resolve around 12:58 PM leaving   burst and suppression pattern. Bursts are consisted of mixed frequencies predominantly theta and delta slowing lasting between 3-8 seconds, alternating with 2-4 seconds of suppressed background activities.  Within these  a very frequent right anterior temporal and frontal spikes present at times carrying evolving features and  suspicious for brief subclinical electrographic seizures arising from the right frontal and frontotemporal region.   broad negative often extends  to the right parietal as well as paracentral region.   Around 7 am background activities appear to be marked by near continuous suppressed background activities with superimposed generalized anterior dominant sharp waves with shifting negative to the right hemisphere.  Such  generalized sharp waves  and  spikes occur fairly frequently , every 2 to 3 seconds with  frequency 2-2.5 cps.    Clinical interpretation: This 24 hours of intensive EEG monitoring with simultaneous monitoring was abnormal due to  presence of convulsive status epilepticus during the initial portion of the recording  subsequent resolution.   Frequent generalized sharp waves and spikes were present superimposed on suppressed background activities and suggestive of severe encephalopathy and cortical irritability most likely due to cerebral anoxia based on patient's history.  In addition right frontotemporal spikes and subclinical seizures were present  throughout the recording. Although,  At the end of the recording there is no evidence for nonconvulsive status epilepticus, however there is evidence of significant cortical irritability present.  These findings were discussed with ordering physician at the time of interpretation.  Clinical correlation is advised.      Beginning time: 07/03/14 Ending time: 06/10/2014  Day of study: day 2    This 24 hours of intensive EEG monitoring with simultaneous video monitoring was performed for this patient with  Status post cardiac arrest at home.  found unresponsive.  Patient is now intubated and sedated with propofol   Medications: propofol,  Keppra, vimpat, vanc  Background activity is marked  by absence of normal activities of wakefulness or sleep.  The  background activities are marked  by suppressed background activities with superimposed generalized anterior dominant sharp waves and spikes with shifting negative to the right hemisphere.  At time present in isolation and at time as a short 3-4 seconds runs 2-3 cps without evolution. This background activities pattern tend to alternate with  burst and suppression pattern where bursts are consisted of mixed frequencies predominantly theta and  delta slowing lasting between 3-8 seconds, alternating with 2-4 seconds of suppressed background  activities.  Within these bursts  right fronto temporal  spikes again present , at time in rhythmic  runs.   No clinical correlate was noted on EEG tech events sheet and no events PB were activated. EEG tech noted that EEG was non reactive to noxious stimuli although the time of testing was not marked on EEG recording  or EEG tech log.    Clinical interpretation: This 24 hours of intensive EEG monitoring with simultaneous monitoring was abnormal and consistent with severe encephalopathy of nonspecific etiology although  most likely related to cerebral  anoxia given patient's history. Although there is no evidence of nonconvulsive status epileptics, superimposed frequent generalized  sharp waves and spikes  are suggestive of cortical irritability due to above.   Right frontotemporal sharp waves and spikes suggestive of possibly a greater extent of neuronal dysfunction and  cortical  irritability in her right frontotemporal region.  clinical correlation is advised.

## 2014-07-03 NOTE — Progress Notes (Addendum)
Subjective: Patient remains unresponsive. Occasional seizure-like movements of left shoulder and upper extremity and has been noted.  Objective: Current vital signs: BP 96/38  Pulse 77  Temp(Src) 98.2 F (36.8 C) (Core (Comment))  Resp 18  Ht 5\' 9"  (1.753 m)  Wt 70 kg (154 lb 5.2 oz)  BMI 22.78 kg/m2  SpO2 100%  Neurologic Exam: Intubated and on mechanical ventilation. Patient was unresponsive to noxious stimuli. He is sedated with propofol, currently at 60 mcg per kilogram per minute. Pupils were equal and reacted equally to light. Extraocular movements are absent with oculocephalic maneuvers. Minimal response to corneal stimulation. No facial asymmetry noted. Muscle tone was flaccid throughout. Patient had no abnormal posturing no spontaneous movements. Plantar responses were mute.  Medications: I have reviewed the patient's current medications.  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 Patient was noted to be in status epilepticus yesterday and propofol was increased as well as Keppra added. Status epilepticus was broken. However patient has continued to show intermittent right focal seizure activity, largely frontal temporal. Keppra has been increased and Vimpat added. Long-term EEG monitoring was continued and propofol taper started.  Prognosis at this point is likely poor. No definitive prognosis can be determined once patient is off sedating medication. MRI of the brain has been ordered, to rule out ischemic changes, as well as cerebral edema.  We will continue to follow this patient closely with you.  C.R. Roseanne Reno, MD [facility] [phone]  07/03/2014  1:56 PM

## 2014-07-03 NOTE — Progress Notes (Signed)
eLink Physician-Brief Progress Note Patient Name: Bryan Long DOB: July 10, 1929 MRN: [id number]  Date of Service  07/03/2014   HPI/Events of Note    Discussed code status with daughter per her request.  Family consensus - patient would want to be DNR under the circumstances.   eICU Interventions   Orders placed   Intervention Category Intermediate Interventions: Communication with other healthcare providers and/or family  Lonia Farber 07/03/2014, 5:52 PM

## 2014-07-03 NOTE — Progress Notes (Signed)
LTM checked, restarted for day 2 of recording.

## 2014-07-03 NOTE — Progress Notes (Signed)
PULMONARY / CRITICAL CARE MEDICINE   Name: Bryan Long MRN: [id number] DOB: June 12, 1929    ADMISSION DATE:  06/16/2014  REFERRING MD :  EDP   CHIEF COMPLAINT:  Cardiac arrest  INITIAL PRESENTATION: 80 yo admiited 7/23 after VF arrest at home.  Approximate 15 minute downtime. Intubated in field, required shock x 3 + ACLS.  STUDIES / EVENTS:  7/23  CT head:  neg 7/23  Cardiac Cath: severe multivessel CAD 7/23  Hypothermia started 7/24  TTE EF 30-35%  7/24 PM - rewarmed 7/25 vent dyssynchrony. Propofol initiated 7/26 Tolerates PS 5-10 cm H2O. Cognition prohibits extubation. RASS -3. Not F/C.  7/26 CT head: NAICP 7/26 CT neck: spondylosis, osteoarthritis, carotid calcifications. No subluxations 7/27 EEG: status epilepticus, started on Keppra  LINES: ETT  7/23 >>  L IJ CVL 7/23 >>  R radial art-line 7/23 >>    CULTURES: Urine 7/23 >> NEG Resp 7/26 >> OPF  ANTIBIOTICS: Levofloxacin 7/26 >> 7/27 Vanc 7/27 >> Cefepime 7/27 >>  INTERVAL HISTORY:  Seizures yesterday, started on keppra, continuous EEG  VITAL SIGNS: Temp:  [98.1 F (36.7 C)-99.1 F (37.3 C)] 98.2 F (36.8 C) (07/28 0800) Pulse Rate:  [70-87] 83 (07/28 0959) Resp:  [7-22] 22 (07/28 0800) BP: (95-150)/(31-90) 150/90 mmHg (07/28 0959) SpO2:  [100 %] 100 % (07/28 0800) Arterial Line BP: (105-137)/(34-75) 132/45 mmHg (07/28 0900) FiO2 (%):  [40 %] 40 % (07/28 0800)  HEMODYNAMICS:    VENTILATOR SETTINGS: Vent Mode:  [-] PRVC FiO2 (%):  [40 %] 40 % Set Rate:  [14 bmp] 14 bmp Vt Set:  [580 mL] 580 mL PEEP:  [5 cmH20] 5 cmH20 Pressure Support:  [10 cmH20] 10 cmH20 Plateau Pressure:  [13 cmH20-27 cmH20] 17 cmH20  INTAKE / OUTPUT: Intake/Output     07/27 0701 - 07/28 0700 07/28 0701 - 07/29 0700   I.V. (mL/kg) 1686 (24.1) 150 (2.1)   NG/GT 1970 100   IV Piggyback 550 100   Total Intake(mL/kg) 4206 (60.1) 350 (5)   Urine (mL/kg/hr) 2625 (1.6) 350 (1.3)   Stool     Total Output 2625 350   Net  +1581 0          PHYSICAL EXAMINATION:  Gen; sedated on vent HEENT: NCAT, ETT in place PULM: few crackles bilaterally CV: RRR, difficult exam due to pads AB: limited by pads Ext: cool, no edema Neuro: sedated on vent, no response to external stimuli  LABS: I have reviewed all of today's lab results. Relevant abnormalities are discussed in the A/P section  7/27 CXR: reviewed by me, bibasilar infiltrates R > L, ETT in place  ASSESSMENT / PLAN:  PULMONARY A: Acute respiratory failure in setting of cardiac arrest HCAP P:  Cont full vent support - settings reviewed and/or adjusted Cont vent bundle See ID  CARDIOVASCULAR A:  S/P VF arrest 7/23 NSTEMI Severe multivessel CAD Cardiomyopathy (LVEF 30-35%) Mild hypotension, resolved P:  Cont ASA, statin Heparin per cardiology Cards following  RENAL A:   AKI, nonoliguric > improving P:   Monitor BMET intermittently Monitor I/Os Correct electrolytes as indicated  GASTROINTESTINAL A:   Mildly elevated LFTs P:   SUP: enteral PPI Cont TFs Monitor LFTs intermittently  HEMATOLOGIC A:   Mild anemia without acute blood loss P:  DVT px: full dose heparin Monitor CBC intermittently Transfuse for Hgb < 7gm/dL  INFECTIOUS A:   HCAP P:   D/c vanc Continue cefepime 7 days  ENDOCRINE A:   Hyperglycemia  resolved P:   CBGs q 8 hrs Resume SSI for glu > 180  NEUROLOGIC A:   Post anoxic acute encephalopathy > dismal prognosis Status epilepticus P:   Hold MRI brain while on continuous EEG Keppra/Vimpat per Neurology Would like to suppress seizure and stop propofol.  If no meaningful evidence of neurologic improvement at that point would withdraw care. RASS goal -1 Propofol until seizures suppressed Cont PRN fent  Today's summary: Poor prognosis.  Would like to suppress seizure and stop propofol.  If no meaningful evidence of neurologic improvement at that point would withdraw care.  I have personally  obtained history, examined patient, evaluated and interpreted laboratory and imaging results, reviewed medical records, formulated assessment / plan and placed orders.  CRITICAL CARE:  The patient is critically ill with multiple organ systems failure and requires high complexity decision making for assessment and support, frequent evaluation and titration of therapies, application of advanced monitoring technologies and extensive interpretation of multiple databases. Critical Care Time devoted to patient care services described in this note is 33 minutes.   Family updated in detail  Heber Bogue, MD  PCCM Pager: [phone] Cell: [phone] If no response, call [phone]   07/03/2014, 10:52 AM

## 2014-07-03 NOTE — Progress Notes (Signed)
Subjective:  No response, but is sedated.  Events since yesterday reviewed.  Was in status on EEG and begun on Keppra.   Objective:  Vital Signs in the last 24 hours: BP 111/38  Pulse 87  Temp(Src) 98.6 F (37 C) (Core (Comment))  Resp 21  Ht 5\' 9"  (1.753 m)  Wt 70 kg (154 lb 5.2 oz)  BMI 22.78 kg/m2  SpO2 100%  Physical Exam: Elderly WM intubated, sedated, not responsive Lungs:  Clear  Cardiac:  Regular rhythm, normal S1 and S2, no S3 3/6 systolic murmur Extremities:  No edema present, cool  Intake/Output from previous day: 07/27 0701 - 07/28 0700 In: 4081 [I.V.:1611; NG/GT:1920; IV Piggyback:550] Out: 2625 [Urine:2625] Weight Filed Weights   2014/07/27 1200 07/01/14 0500  Weight: 68.04 kg (150 lb) 70 kg (154 lb 5.2 oz)    Lab Results: Basic Metabolic Panel:  Recent Labs  84/66/59 0400 07/03/14 0315  NA 136* 137  K 4.1 3.9  CL 106 107  CO2 17* 20  GLUCOSE 121* 112*  BUN 33* 28*  CREATININE 1.31 1.12    CBC:  Recent Labs  07/02/14 0400 07/03/14 0315  WBC 10.9* 8.0  HGB 9.5* 9.2*  HCT 28.3* 27.3*  MCV 88.7 88.6  PLT 157 156    BNP    Component Value Date/Time   PROBNP 4283.0* Jul 27, 2014 1200    PROTIME: Lab Results  Component Value Date   INR 1.33 27-Jul-2014   INR 1.33 2014/07/27   INR 1.0 05/26/2007    Telemetry:  sinus  Assessment/Plan:  1. Out of hospital cardiac arrest with some anoxic encephalopathy and seizures now. 2. Severe AS, severe 3VD 3. Carotid artery disease with 90% LICA stenosis 4. ARF due to cardiac arrest improved  Rec:  Spoke to Dr. Roseanne Reno.  Plan is to continue Keppra and stop propofol today to see if will wake up.  Wife updated.  Suspect that seizures are a poor prognostic sign.    Darden Palmer  MD [facility]  07/03/2014, 8:48 AM

## 2014-07-03 NOTE — Progress Notes (Signed)
ANTICOAGULATION CONSULT NOTE Pharmacy Consult for Heparin  Indication: chest pain/ACS  No Known Allergies  Patient Measurements: Height: 5\' 9"  (175.3 cm) Weight: 154 lb 5.2 oz (70 kg) IBW/kg (Calculated) : 70.7  Vital Signs: Temp: 98.2 F (36.8 C) (07/28 0800) Temp src: Core (Comment) (07/28 1133) BP: 96/38 mmHg (07/28 1305) Pulse Rate: 77 (07/28 1305)  Labs:  Recent Labs  07/01/14 0315  07/02/14 0400 07/02/14 1300 07/02/14 2210 07/03/14 0315  HGB 10.7*  --  9.5*  --   --  9.2*  HCT 32.2*  --  28.3*  --   --  27.3*  PLT 196  --  157  --   --  156  HEPARINUNFRC 0.19*  < > <0.10* 0.21* 0.58 0.52  CREATININE 1.81*  --  1.31  --   --  1.12  < > = values in this interval not displayed.  Estimated Creatinine Clearance: 49.5 ml/min (by C-G formula based on Cr of 1.12).  Assessment: 80 y/o Male s/p cardiac arrest and hypothermia protocol, awaiting possible CABG, on heparin. Now therapeutic on 1550 units/hr. No bleeding issues noted.  Goal of Therapy:  Heparin level 0.3-0.7 units/ml Monitor platelets by anticoagulation protocol: Yes   Plan:  Continue Heparin at current rate  Follow-up am labs.  Sheppard Coil PharmD., BCPS Clinical Pharmacist Pager [phone] 07/03/2014 3:23 PM

## 2014-07-04 ENCOUNTER — Inpatient Hospital Stay (HOSPITAL_COMMUNITY): Payer: Medicare Other

## 2014-07-04 LAB — CBC
HCT: 26.1 % — ABNORMAL LOW (ref 39.0–52.0)
Hemoglobin: 8.9 g/dL — ABNORMAL LOW (ref 13.0–17.0)
MCH: 30.1 pg (ref 26.0–34.0)
MCHC: 34.1 g/dL (ref 30.0–36.0)
MCV: 88.2 fL (ref 78.0–100.0)
PLATELETS: 159 10*3/uL (ref 150–400)
RBC: 2.96 MIL/uL — ABNORMAL LOW (ref 4.22–5.81)
RDW: 17.2 % — ABNORMAL HIGH (ref 11.5–15.5)
WBC: 10.7 10*3/uL — AB (ref 4.0–10.5)

## 2014-07-04 LAB — GLUCOSE, CAPILLARY
GLUCOSE-CAPILLARY: 107 mg/dL — AB (ref 70–99)
Glucose-Capillary: 113 mg/dL — ABNORMAL HIGH (ref 70–99)

## 2014-07-04 LAB — BASIC METABOLIC PANEL
Anion gap: 14 (ref 5–15)
BUN: 26 mg/dL — ABNORMAL HIGH (ref 6–23)
CHLORIDE: 106 meq/L (ref 96–112)
CO2: 21 mEq/L (ref 19–32)
Calcium: 7.9 mg/dL — ABNORMAL LOW (ref 8.4–10.5)
Creatinine, Ser: 0.96 mg/dL (ref 0.50–1.35)
GFR calc Af Amer: 86 mL/min — ABNORMAL LOW (ref >90)
GFR calc non Af Amer: 75 mL/min — ABNORMAL LOW (ref >90)
Glucose, Bld: 131 mg/dL — ABNORMAL HIGH (ref 70–99)
POTASSIUM: 4.1 meq/L (ref 3.7–5.3)
SODIUM: 141 meq/L (ref 137–147)

## 2014-07-04 LAB — HEPARIN LEVEL (UNFRACTIONATED): Heparin Unfractionated: 0.59 IU/mL (ref 0.30–0.70)

## 2014-07-04 MED ORDER — SODIUM CHLORIDE 0.9 % IV SOLN
1.0000 mg/h | INTRAVENOUS | Status: DC
  Administered 2014-07-04: 4 mg/h via INTRAVENOUS
  Administered 2014-07-04: 8 mg/h via INTRAVENOUS
  Filled 2014-07-04 (×2): qty 10

## 2014-07-07 NOTE — Progress Notes (Signed)
Day 3 LTM, restarted.

## 2014-07-07 NOTE — Progress Notes (Signed)
Terminal extubation done to RA per MD

## 2014-07-07 NOTE — Progress Notes (Signed)
[facility] PCCM Family has discussed situation with neurology and they agree with withdrawal of care. Orders placed. Heber [place], MD [place] PCCM Pager: [phone] Cell: [phone] If no response, call [phone]

## 2014-07-07 NOTE — Progress Notes (Signed)
Per Dr Kendrick Fries to terminal extubate pt to RA. RT with RN at bedside extubated pt to RA with no comlpications

## 2014-07-07 NOTE — Progress Notes (Signed)
06/09/2014 12:11 PM Withdrawal of care. Family at bedside along with pastor.  Yailin Biederman, [place] Fulling

## 2014-07-07 NOTE — Progress Notes (Signed)
Subjective:  No response, but is sedated.  When propofol weaned began to have more twitching and was turned back up last night at family request. No appreciable return of consciousness when propofol turned down. Family made patient DNR.  Objective:  Vital Signs in the last 24 hours: BP 114/41  Pulse 81  Temp(Src) 98.2 F (36.8 C) (Core (Comment))  Resp 19  Ht 5\' 9"  (1.753 m)  Wt 70 kg (154 lb 5.2 oz)  BMI 22.78 kg/m2  SpO2 100%  Physical Exam: Elderly WM intubated, sedated, not responsive Lungs:  Clear  Cardiac:  Regular rhythm, normal S1 and S2, no S3 3/6 systolic murmur Extremities:  No edema present, cool  Intake/Output from previous day: 07/28 0701 - 07/29 0700 In: 3585.7 [I.V.:1590.7; NG/GT:1700; IV Piggyback:295] Out: 2075 [Urine:1875; Stool:200] Weight Filed Weights   06/16/2014 1200 07/01/14 0500  Weight: 68.04 kg (150 lb) 70 kg (154 lb 5.2 oz)    Lab Results: Basic Metabolic Panel:  Recent Labs  33/82/50 0315 08-01-14 0340  NA 137 141  K 3.9 4.1  CL 107 106  CO2 20 21  GLUCOSE 112* 131*  BUN 28* 26*  CREATININE 1.12 0.96    CBC:  Recent Labs  07/03/14 0315 2014/08/01 0340  WBC 8.0 10.7*  HGB 9.2* 8.9*  HCT 27.3* 26.1*  MCV 88.6 88.2  PLT 156 159    BNP    Component Value Date/Time   PROBNP 4283.0* 07/03/2014 1200    PROTIME: Lab Results  Component Value Date   INR 1.33 07/05/2014   INR 1.33 06/12/2014   INR 1.0 05/26/2007    Telemetry:  sinus  Assessment/Plan:  1. Out of hospital cardiac arrest with anoxic encephalopathy and seizures 2. Severe AS, severe 3VD 3. Carotid artery disease with 90% LICA stenosis 4. ARF due to cardiac arrest improved  Rec:  Spoke with family in detail.  Appears that no significant progress made neurologically. They are coming to terms with it and likely would support comfort care.   Darden Palmer  MD [facility]  Aug 01, 2014, 9:11 AM

## 2014-07-07 NOTE — Progress Notes (Addendum)
Subjective: Patient remains unresponsive  Attempts made to decrease Propofol on yesterday but patient noted to have jerking activity that was felt to be seizure and Propofol restarted.  No further clinical seizure activity noted.  EEG shows burst suppression activity.    Objective: Current vital signs: BP 106/40  Pulse 81  Temp(Src) 98.2 F (36.8 C) (Core (Comment))  Resp 25  Ht 5\' 9"  (1.753 m)  Wt 70 kg (154 lb 5.2 oz)  BMI 22.78 kg/m2  SpO2 100% Vital signs in last 24 hours: Temp:  [97.5 F (36.4 C)-99.7 F (37.6 C)] 98.2 F (36.8 C) (07/29 0700) Pulse Rate:  [72-84] 81 (07/29 0904) Resp:  [14-25] 25 (07/29 1000) BP: (96-151)/(38-55) 106/40 mmHg (07/29 1000) SpO2:  [100 %] 100 % (07/29 1000) Arterial Line BP: (108-139)/(38-62) 119/38 mmHg (07/29 1000) FiO2 (%):  [40 %] 40 % (07/29 0904)  Intake/Output from previous day: 07/28 0701 - 07/29 0700 In: 3785.7 [I.V.:1590.7; NG/GT:1900; IV Piggyback:295] Out: 2075 [Urine:1875; Stool:200] Intake/Output this shift: Total I/O In: 494.4 [I.V.:194.4; NG/GT:150; IV Piggyback:150] Out: 15 [Urine:15] Nutritional status:    Neurologic Exam: Mental Status: Patient does not respond to verbal stimuli.  Does not respond to deep sternal rub.  Does not follow commands.  No verbalizations noted.  Cranial Nerves: II: patient does not respond confrontation bilaterally, pupils right 3 mm, left 3 mm,and reactvie bilaterally III,IV,VI: doll's response absent bilaterally.  V,VII: corneal reflex absent bilaterally  VIII: patient does not respond to verbal stimuli IX,X: gag reflex reduced, XI: trapezius strength unable to test bilaterally XII: tongue strength unable to test Motor: Extremities flaccid throughout.  No spontaneous movement noted.  No purposeful movements noted. Sensory: Does not respond to noxious stimuli in any extremity. Deep Tendon Reflexes:  1+ in the upper extremities and absent in the lower extremities.   Plantars: mute  bilaterally Cerebellar: Unable to perform    Lab Results: Basic Metabolic Panel:  Recent Labs Lab 06/30/14 0400 07/01/14 0315 07/02/14 0400 07/02/14 1400 07/03/14 0315 07/02/2014 0340  NA 138 138 136*  --  137 141  K 4.1 4.7 4.1  --  3.9 4.1  CL 105 104 106  --  107 106  CO2 17* 17* 17*  --  20 21  GLUCOSE 102* 106* 121*  --  112* 131*  BUN 19 27* 33*  --  28* 26*  CREATININE 1.33 1.81* 1.31  --  1.12 0.96  CALCIUM 7.1* 7.0* 7.3*  --  7.6* 7.9*  MG  --   --   --  1.6  --   --   PHOS  --   --   --  2.0*  --   --     Liver Function Tests:  Recent Labs Lab 06/30/2014 1238 07/01/14 0315  AST 175* 120*  ALT 150* 98*  ALKPHOS 83 72  BILITOT 0.8 0.5  PROT 6.0 5.7*  ALBUMIN 2.8* 2.5*   No results found for this basename: LIPASE, AMYLASE,  in the last 168 hours No results found for this basename: AMMONIA,  in the last 168 hours  CBC:  Recent Labs Lab 06/27/2014 1200  06/30/14 0400 07/01/14 0315 07/02/14 0400 07/03/14 0315 06/13/2014 0340  WBC 17.3*  < > 15.1* 16.0* 10.9* 8.0 10.7*  NEUTROABS 10.0*  --   --   --   --   --   --   HGB 10.5*  < > 12.2* 10.7* 9.5* 9.2* 8.9*  HCT 32.3*  < > 35.4* 32.2* 28.3*  27.3* 26.1*  MCV 92.6  < > 88.1 90.2 88.7 88.6 88.2  PLT 202  < > 224 196 157 156 159  < > = values in this interval not displayed.  Cardiac Enzymes:  Recent Labs Lab 07/02/2014 1200 06/29/14 1200  TROPONINI 0.38* 5.29*    Lipid Panel:  Recent Labs Lab 06/30/14 0637 07/03/14 0315  TRIG 78 73    CBG:  Recent Labs Lab 07/03/14 0717 07/03/14 1135 07/03/14 1635 06/16/2014 0007 06/11/2014 0753  GLUCAP 89 108* 116* 107* 113*    Microbiology: Results for orders placed during the hospital encounter of 07/01/2014  URINE CULTURE     Status: None   Collection Time    06/20/2014 12:05 PM      Result Value Ref Range Status   Specimen Description URINE, CATHETERIZED   Final   Special Requests NONE   Final   Culture  Setup Time     Final   Value: 06/11/2014  18:29     Performed at [facility] Count     Final   Value: NO GROWTH     Performed at [facility]   Culture     Final   Value: NO GROWTH     Performed at [facility]   Report Status 06/29/2014 FINAL   Final  MRSA PCR SCREENING     Status: None   Collection Time    06/23/2014  5:20 PM      Result Value Ref Range Status   MRSA by PCR NEGATIVE  NEGATIVE Final   Comment:            The GeneXpert MRSA Assay (FDA     approved for NASAL specimens     only), is one component of a     comprehensive MRSA colonization     surveillance program. It is not     intended to diagnose MRSA     infection nor to guide or     monitor treatment for     MRSA infections.  CULTURE, RESPIRATORY (NON-EXPECTORATED)     Status: None   Collection Time    07/01/14 10:50 AM      Result Value Ref Range Status   Specimen Description TRACHEAL ASPIRATE   Final   Special Requests NONE   Final   Gram Stain     Final   Value: FEW WBC PRESENT, PREDOMINANTLY PMN     RARE SQUAMOUS EPITHELIAL CELLS PRESENT     MODERATE GRAM POSITIVE RODS     ABUNDANT GRAM POSITIVE COCCI IN PAIRS     IN CHAINS IN CLUSTERS MODERATE GRAM NEGATIVE RODS   Culture     Final   Value: Non-Pathogenic Oropharyngeal-type Flora Isolated.     Performed at [facility]   Report Status 07/03/2014 FINAL   Final    Coagulation Studies: No results found for this basename: LABPROT, INR,  in the last 72 hours  Imaging: Dg Chest Port 1 View  06/11/2014   CLINICAL DATA:  Endotracheal placement  EXAM: PORTABLE CHEST - 1 VIEW  COMPARISON:  07/02/2014  FINDINGS: Endotracheal tube has its tip 5 cm above the carina. Nasogastric tube enters the abdomen. Left internal jugular central line has its tip at the innominate SVC junction. Pleural effusions are probably increasing, more on the right than the left. There is volume loss an or pneumonia in both lower lobes. The upper lobes are clear.  IMPRESSION: Increasing  effusions, right  more than left. Lower lobe volume loss an or pneumonia.   Electronically Signed   By: Paulina FusiMark  Shogry M.D.   On: 07/01/2014 07:14    Medications:  I have reviewed the patient's current medications. Scheduled: . antiseptic oral rinse  15 mL Mouth Rinse QID  . aspirin  81 mg Per Tube Daily  . ceFEPime (MAXIPIME) IV  1 g Intravenous Q24H  . chlorhexidine  15 mL Mouth Rinse BID  . free water  200 mL Per Tube 3 times per day  . lacosamide (VIMPAT) IV  200 mg Intravenous Q12H  . levETIRAcetam  1,500 mg Intravenous Q12H  . metoprolol tartrate  12.5 mg Oral BID  . pantoprazole sodium  40 mg Per Tube Q1200  . sodium chloride  3 mL Intravenous Q12H    Assessment/Plan: 80 year old male s/p cardiac arrest.  Patient has remained unresponsive with difficult to control seizures despite Keppra and Vimpat.  Patient on Propofol in burst suppression.  Last head CT on 7/26 was unremarkable but initial blood work suggested hypoperfusion injury to both the liver and the kidneys.  Patient also with significant carotid disease. Neurological examination is not consistent with brain death at this time but clinical history does suggest a very poor prognosis with patient being extremely unlikely to return to a functional state due to anoxic brain injury.  This was discussed with family and Dr. Kendrick FriesMcQuaid.  Decision has been made for discontinuation of care.   Will continue anticonvulsant therapy.     LOS: 6 days   Thana FarrLeslie Lesslie Mckeehan, MD [facility] [phone] 06/18/2014  10:30 AM

## 2014-07-07 NOTE — Progress Notes (Signed)
Pt noted to be asystole at 2111. Family at bedside notified at this time. Auscultated and palpated no lung or heart sounds for 2 minutes by this RN and Wendelyn Breslow, RN. CDS notified. All belongings given to family. Wasted 25cc of versed with Wendelyn Breslow, RN.

## 2014-07-07 NOTE — Progress Notes (Signed)
PULMONARY / CRITICAL CARE MEDICINE   Name: Bryan Long MRN: [id number] DOB: 1928/12/28    ADMISSION DATE:  06/24/2014  REFERRING MD :  EDP   CHIEF COMPLAINT:  Cardiac arrest  INITIAL PRESENTATION: 80 yo admiited 7/23 after VF arrest at home.  Approximate 15 minute downtime. Intubated in field, required shock x 3 + ACLS.  STUDIES / EVENTS:  7/23  CT head:  neg 7/23  Cardiac Cath: severe multivessel CAD 7/23  Hypothermia started 7/24  TTE EF 30-35%  7/24 PM - rewarmed 7/25 vent dyssynchrony. Propofol initiated 7/26 Tolerates PS 5-10 cm H2O. Cognition prohibits extubation. RASS -3. Not F/C.  7/26 CT head: NAICP 7/26 CT neck: spondylosis, osteoarthritis, carotid calcifications. No subluxations 7/27 EEG: status epilepticus, started on Keppra 7/28 DNR decision from family  LINES: ETT  7/23 >>  L IJ CVL 7/23 >>  R radial art-line 7/23 >>    CULTURES: Urine 7/23 >> NEG Resp 7/26 >> OPF  ANTIBIOTICS: Levofloxacin 7/26 >> 7/27 Vanc 7/27 >> Cefepime 7/27 >>  INTERVAL HISTORY:  Seizures yesterday, started on keppra, continuous EEG  VITAL SIGNS: Temp:  [97.5 F (36.4 C)-99.7 F (37.6 C)] 98.2 F (36.8 C) (07/29 0700) Pulse Rate:  [72-84] 81 (07/29 0904) Resp:  [14-25] 19 (07/29 0900) BP: (96-151)/(38-90) 114/41 mmHg (07/29 0904) SpO2:  [100 %] 100 % (07/29 0904) Arterial Line BP: (108-139)/(38-62) 114/41 mmHg (07/29 0900) FiO2 (%):  [40 %] 40 % (07/29 0904)  HEMODYNAMICS:    VENTILATOR SETTINGS: Vent Mode:  [-] PRVC FiO2 (%):  [40 %] 40 % Set Rate:  [14 bmp] 14 bmp Vt Set:  [580 mL] 580 mL PEEP:  [5 cmH20] 5 cmH20 Plateau Pressure:  [18 cmH20-22 cmH20] 19 cmH20  INTAKE / OUTPUT: Intake/Output     07/28 0701 - 07/29 0700 07/29 0701 - 07/30 0700   I.V. (mL/kg) 1590.7 (22.7) 129.6 (1.9)   NG/GT 1900 100   IV Piggyback 295 150   Total Intake(mL/kg) 3785.7 (54.1) 379.6 (5.4)   Urine (mL/kg/hr) 1875 (1.1) 15 (0.1)   Stool 200 (0.1)    Total Output 2075 15    Net +1710.7 +364.6          PHYSICAL EXAMINATION:  Gen; sedated on vent HEENT: NCAT, ETT in place PULM: few crackles bilaterally CV: RRR, difficult exam due to pads AB: limited by pads Ext: cool, no edema Neuro: sedated on vent, no response to external stimuli  LABS: I have reviewed all of today's lab results. Relevant abnormalities are discussed in the A/P section  7/29 CXR: reviewed by me, bibasilar infiltrates and effusion R>L  ASSESSMENT / PLAN:  PULMONARY A: Acute respiratory failure in setting of cardiac arrest HCAP  CARDIOVASCULAR A:  S/P VF arrest 7/23 NSTEMI Severe multivessel CAD Cardiomyopathy (LVEF 30-35%) Mild hypotension, resolved  RENAL A:   AKI, nonoliguric > improving   GASTROINTESTINAL A:   Mildly elevated LFTs  HEMATOLOGIC A:   Mild anemia without acute blood loss  INFECTIOUS A:   HCAP  ENDOCRINE A:   Hyperglycemia resolved  NEUROLOGIC A:   Post anoxic acute encephalopathy > dismal prognosis Status epilepticus > sounds like propofol needed yesterday to suppress this again P:   Neurology to review yesterday's EEG recording   Today's summary: Poor prognosis.  Likely will proceed with one way extubation today if OK by neurology, family requests this.  Code DNR  I have personally obtained history, examined patient, evaluated and interpreted laboratory and imaging results, reviewed medical  records, formulated assessment / plan and placed orders.  CRITICAL CARE:  The patient is critically ill with multiple organ systems failure and requires high complexity decision making for assessment and support, frequent evaluation and titration of therapies, application of advanced monitoring technologies and extensive interpretation of multiple databases. Critical Care Time devoted to patient care services described in this note is 35 minutes.   Family updated in detail  Heber CarolinaBrent Jackson Coffield, MD [facility] Pager: [phone] Cell:  [phone] If no response, call [phone]   06/15/2014, 9:52 AM

## 2014-07-07 DEATH — deceased

## 2014-07-10 NOTE — Discharge Summary (Signed)
NAMArvilla Market:  Bryan Long, Bryan Long               ACCOUNT NO.:  [id number]  MEDICAL RECORD NO.:  [id number]  LOCATION:  2H11C                        FACILITY:  [facility]  PHYSICIAN:  Bryan Long, MDDATE OF BIRTH:  [birth year]  DATE OF ADMISSION:  05-07-14 DATE OF DISCHARGE:  07/01/2014                              DISCHARGE SUMMARY   DEATH SUMMARY  FINAL DIAGNOSES: 1. Ventricular fibrillation arrest. 2. Anoxic brain injury. 3. Severe aortic stenosis. 4. Coronary artery disease.  HISTORY OF PRESENT ILLNESS:  This is an 80 year old male, who was admitted on June 28, 2014, after a VFib arrest at home.  He had approximately 15 minutes of downtime and had been intubated in the field, and required defibrillation as well as ACLS.  [facility]  See the admission H and P for further details.  PAST MEDICAL HISTORY:  See the admission H and P.  FAMILY HISTORY:  See the admission H and P.  SOCIAL HISTORY:  See the admission H and P.  PHYSICAL EXAMINATION:  VITAL SIGNS:  On admission, temperature 96.6, heart rate 59, respirations 21, blood pressure 168/59, SpO2 100%, FiO2 40%. GENERAL:  Well-developed, well-nourished, elderly male, on the ventilator. NEUROLOGIC:  Prior to sedation, comatose on vent.  Pupils 3 mm, no response to pain, GCS score of 3. HEENT:  Orally endotracheal tube in place.  Pink, moist mucous membranes.  No JVD. CARDIOVASCULAR:  S1 and S2.  Regular rate and rhythm.  Systolic ejection murmur noted. LUNGS:  Respiratory is even nonlabored on vent.  Lungs bilaterally clear. ABDOMEN:  Bounding aortic pulsations, soft and nontender. MUSCULOSKELETAL:  No acute deformity.  C-collar is in place. SKIN:  Warm and dry.  HOSPITAL COURSE:  The patient was admitted to the ICU for further management after a VFib arrest at home.  He underwent a left heart catheterization and this showed severe stenosis of the LAD as well as critical  stenosis of the left circumflex with extensive collateral flow. There was severe segmental LV dysfunction with an associated severely elevated LVEDP, aortic stenosis and mitral regurgitation.  Because of the fact that he had had an out-of-hospital cardiac arrest.  The patient was treated with IV nitroglycerin for this and the induced hypothermia protocol was administered shortly after admission.  After rewarming, he was noted to develop seizure activity, so we consulted Neurology.  An EEG was performed, which showed evidence of status epilepticus.  He was started on two separate antiseizure medicines including Keppra as well as Vimpat.  Despite using these medications, he continued to have jerking movements, which are either seizure activity versus myoclonus. With further discussion with the patient's family, we advised them that this is an exceedingly poor prognosis.  Neurology was also involved in these conversations.  The patient's family noted that the patient would not want to be maintained in the situation based on what they interpreted as his prior wishes.  Therefore, we made his code status DNR and he was extubated from the ventilator and died peacefully with family at the bedside.         ______________________________ Bryan KempsUGLAS BRENT MCQUAID, MD     DBM/MEDQ  D:  07/10/2014  T:  07/10/2014  Job:  [id number]

## 2014-11-15 ENCOUNTER — Encounter (HOSPITAL_COMMUNITY): Payer: Self-pay | Admitting: Cardiovascular Disease

## 2015-12-31 IMAGING — CT CT HEAD W/O CM
3 of 6 series · 14 of 47 positions shown, 17 images · non-contrast
Comparison: Head CT June 28, 2014

CLINICAL DATA: Pain post trauma ; status post cardiac arrest

EXAM:
CT HEAD WITHOUT CONTRAST
CT CERVICAL SPINE WITHOUT CONTRAST
TECHNIQUE: Multidetector CT imaging of the head and cervical spine was
performed following the standard protocol without intravenous
contrast. Multiplanar CT image reconstructions of the cervical spine
were also generated.

[Series 202: head w/o bone, idose (1) · axial · non-contrast · 0.49mm/px · z∈[+229,+261]mm · 2 of 68 slices shown]
[im 14/68  bone]
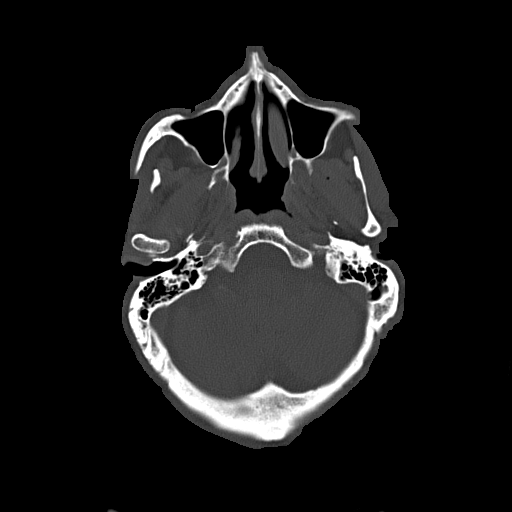
[im 27/68  bone]
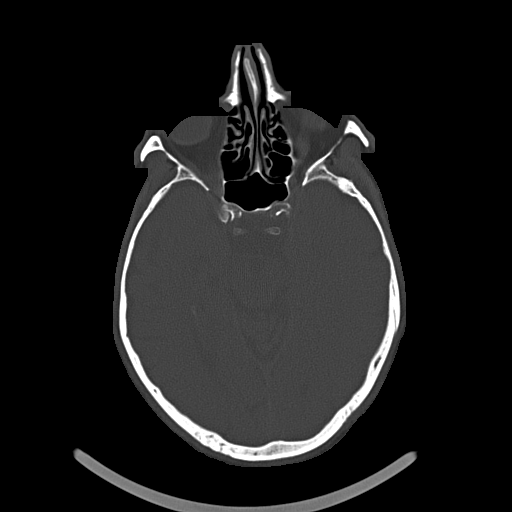

[Series 302: soft tissue, idose (2) · axial · 0.28mm/px · z∈[+45,+235]mm · 9 of 119 slices shown, 12 images]
[im 12/119  brain]
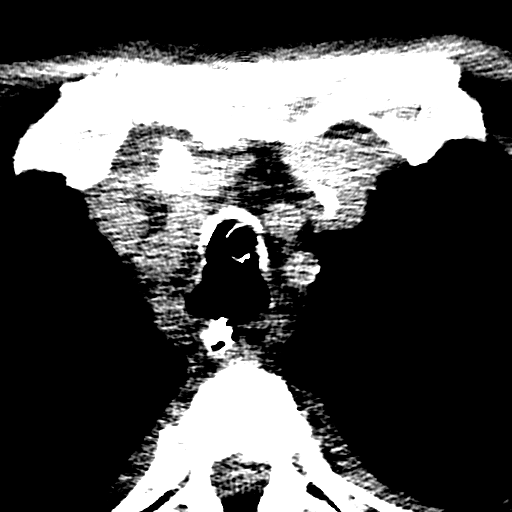
[im 12/119  bone]
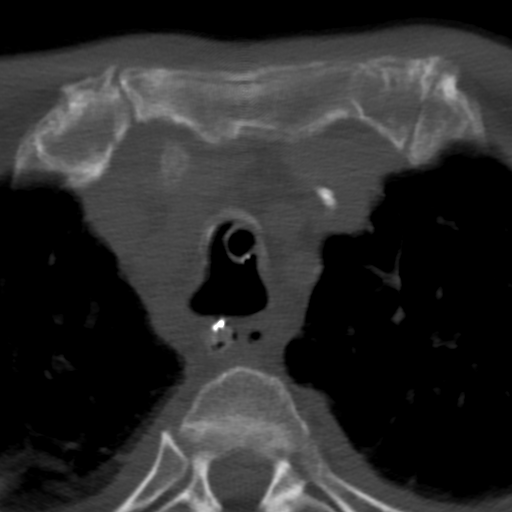
[im 24/119  brain]
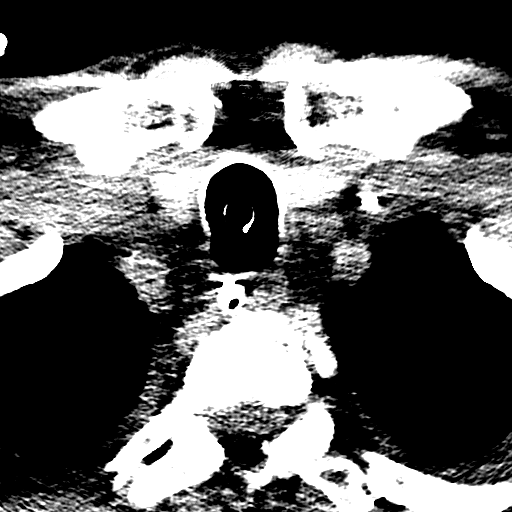
[im 36/119  brain]
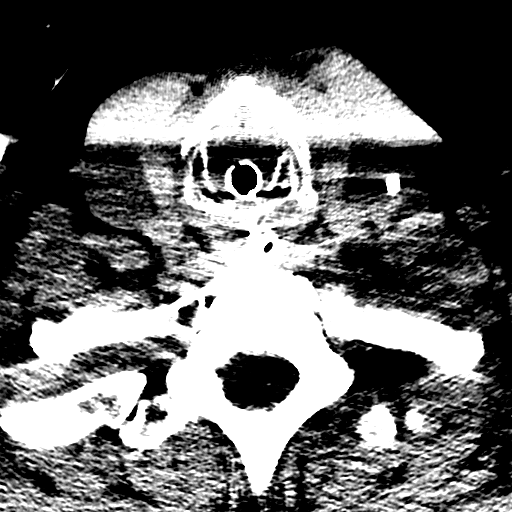
[im 48/119  brain]
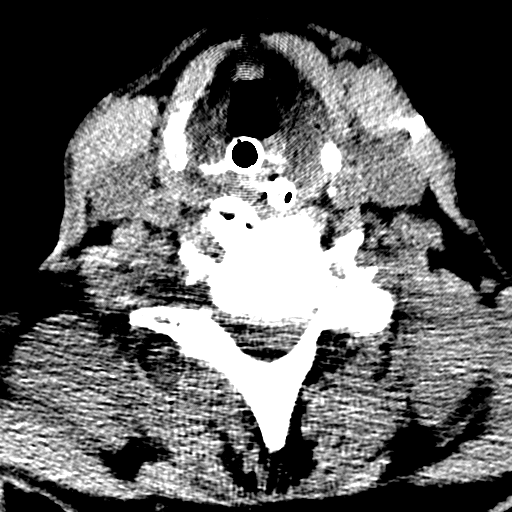
[im 60/119  brain]
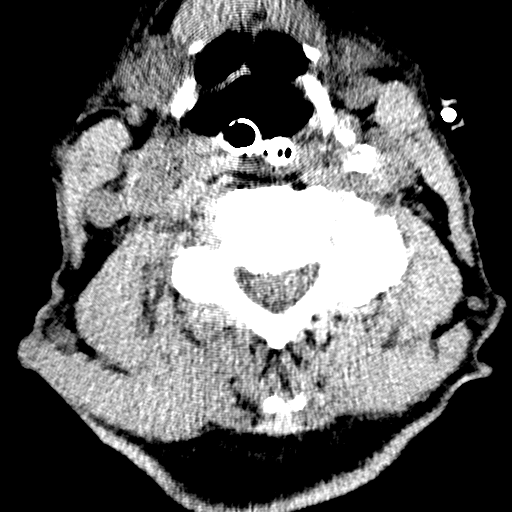
[im 60/119  bone]
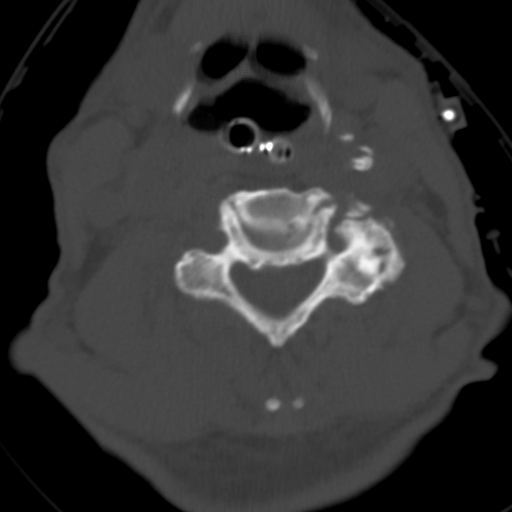
[im 71/119  brain]
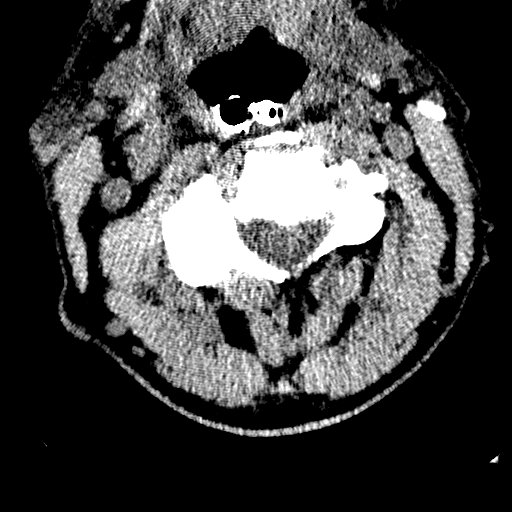
[im 83/119  brain]
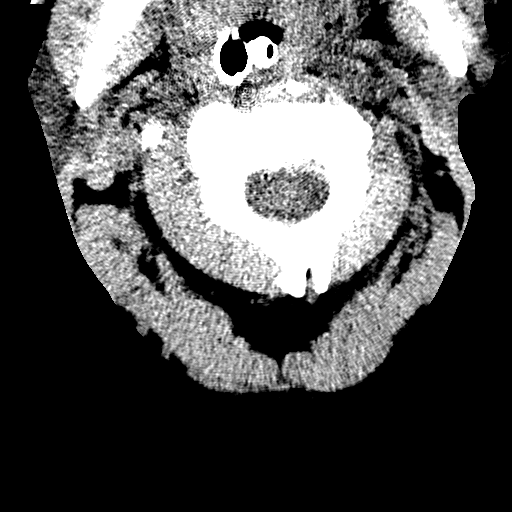
[im 95/119  brain]
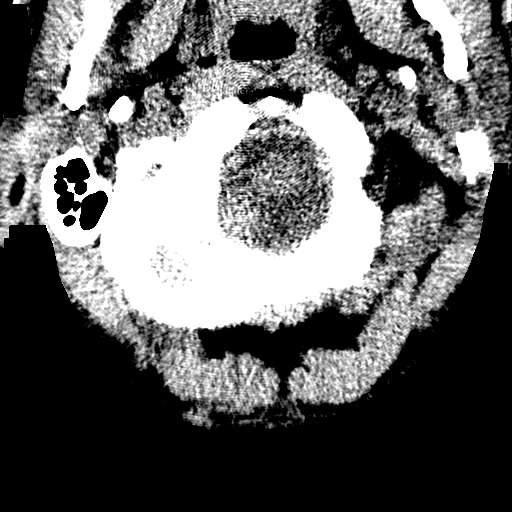
[im 107/119  brain]
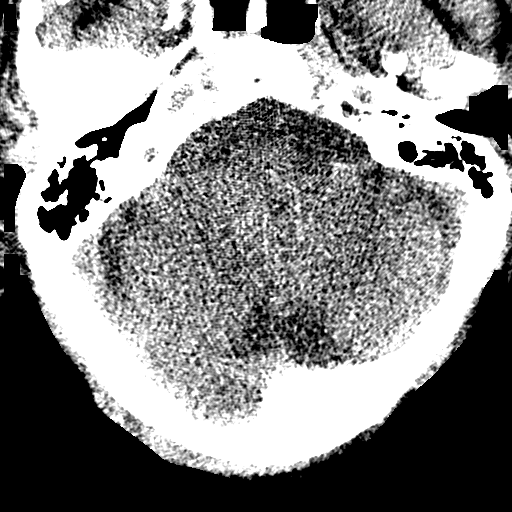
[im 107/119  bone]
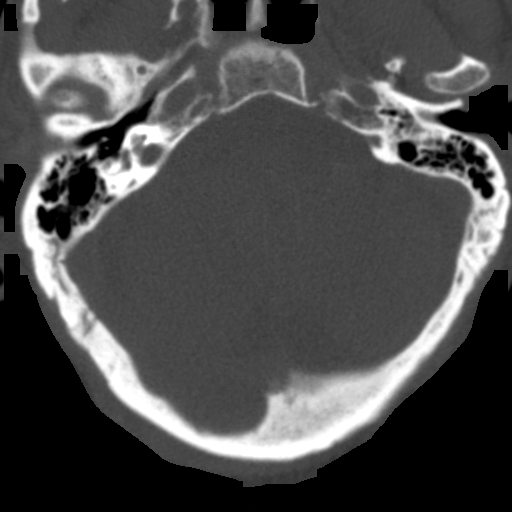

[Series 304: coronal, idose (2) · coronal · 0.28mm/px · 3 of 53 slices shown]
[im 18/53  brain]
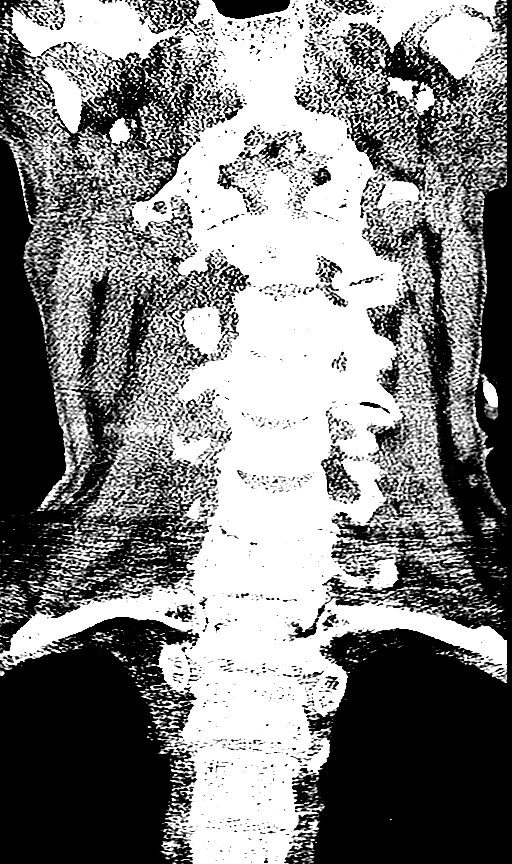
[im 24/53  brain]
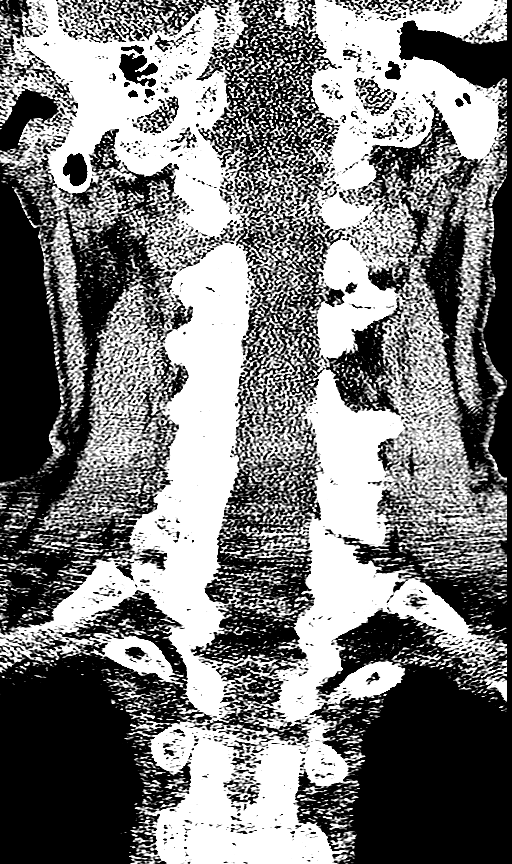
[im 29/53  brain]
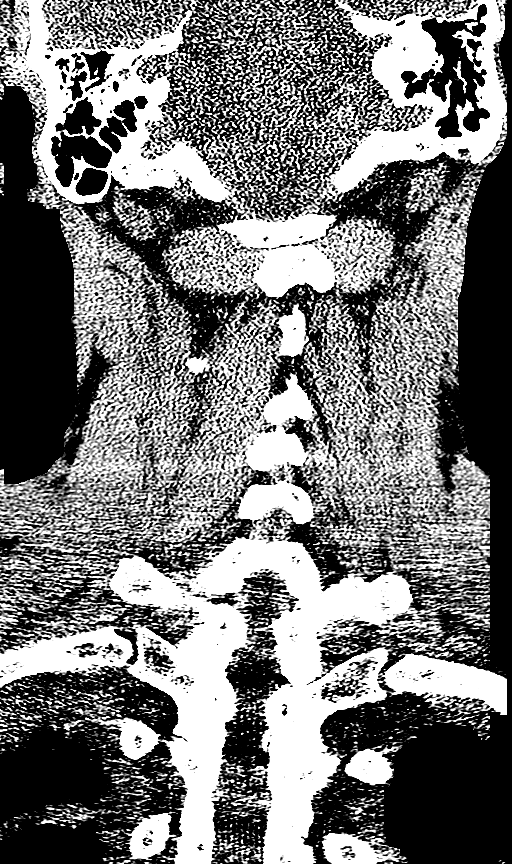

[14 of 47 positions shown; findings below may reference images not displayed]

FINDINGS: CT HEAD FINDINGS

Mild diffuse atrophy is stable. Prominence of the cisterna magna is
an anatomic variant. There is no mass, hemorrhage, extra-axial fluid
collection, or midline shift. Mild periventricular small vessel
disease in the centra semiovale bilaterally is stable. There is no
new gray-white compartment lesion. No acute infarct or brain edema
apparent. Bony calvarium appears intact. The mastoid air cells are
clear.

CT CERVICAL SPINE FINDINGS

There is no fracture or spondylolisthesis. Prevertebral soft tissues
and predental space regions are normal. There is fairly marked disc
space narrowing at C6-7 and C7-T1. There is moderate disc space
narrowing at C3-4, C4-5, and C5-6. There is bony hypertrophy at most
levels bilaterally. Exit foraminal narrowing is rather marked at
C3-4 on the left, C4-5 bilaterally, C5-6 on the right, C6-7 on the
left, and C7-T1 on the left. There is no frank disc extrusion or
stenosis. There is calcification in both carotid arteries, more
severe on the left than on the right. Patient is intubated. There is
also a nasogastric tube in the visualize esophagus.
IMPRESSION: CT head: Mild atrophy with mild small vessel disease, stable. No
intracranial mass, hemorrhage, or acute appearing infarct.

CT cervical spine: Multifocal spondylosis and osteoarthritis. No
fracture or spondylolisthesis. Carotid artery calcification, much
more severe on the left than on the right.

## 2016-01-03 IMAGING — CR DG CHEST 1V PORT
1 series · 1 of 1 positions shown · non-contrast
Comparison: 07/02/2014

CLINICAL DATA: Endotracheal placement

EXAM:
PORTABLE CHEST - 1 VIEW

[AP]
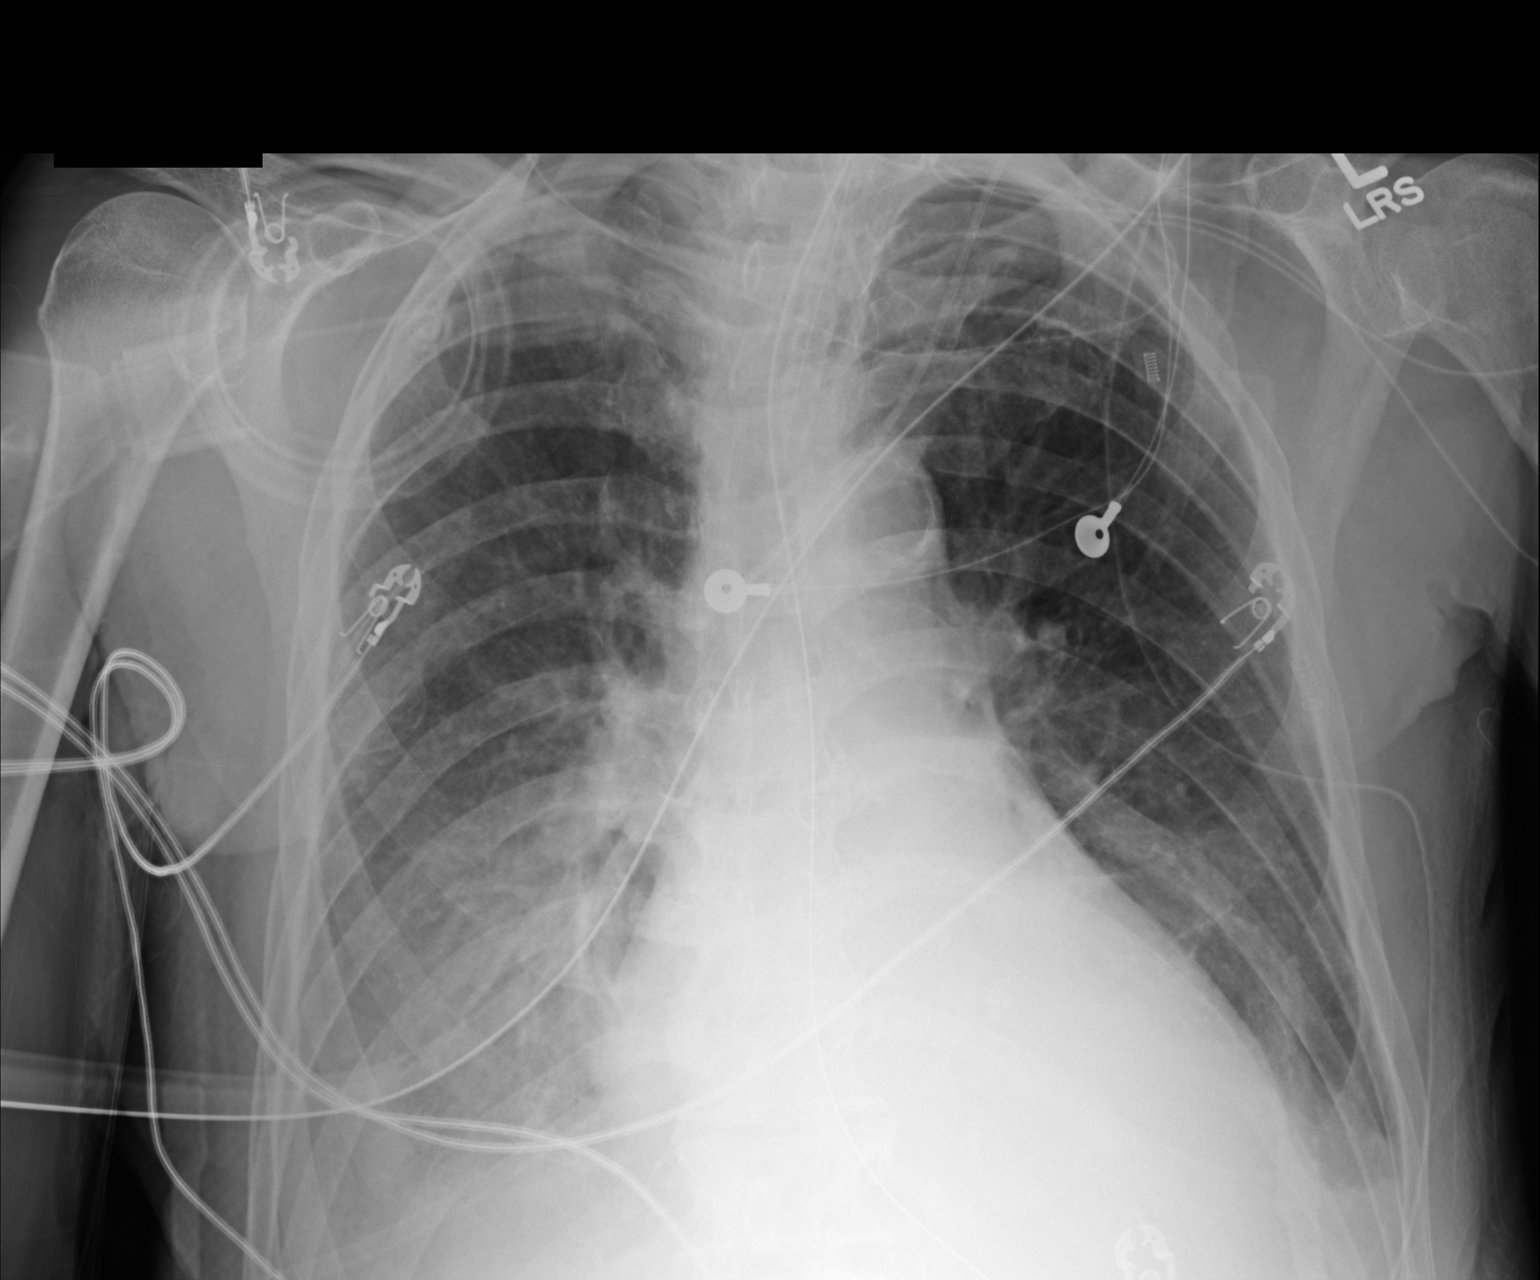

[1 of 1 positions shown; findings below may reference images not displayed]

FINDINGS: Endotracheal tube has its tip 5 cm above the carina. Nasogastric
tube enters the abdomen. Left internal jugular central line has its
tip at the innominate SVC junction. Pleural effusions are probably
increasing, more on the right than the left. There is volume loss an
or pneumonia in both lower lobes. The upper lobes are clear.
IMPRESSION: Increasing effusions, right more than left. Lower lobe volume loss
an or pneumonia.
